# Patient Record
Sex: Male | Born: 1980 | Race: Black or African American | Hispanic: No | Marital: Single | Smoking: Never smoker
Health system: Southern US, Community
[De-identification: ages and names within clinical notes are randomized; demographics above are authoritative.]

---

## 2020-04-20 ENCOUNTER — Encounter (HOSPITAL_COMMUNITY): Payer: Self-pay | Admitting: Family Medicine

## 2020-04-20 ENCOUNTER — Emergency Department (HOSPITAL_COMMUNITY): Payer: BC Managed Care – PPO

## 2020-04-20 ENCOUNTER — Observation Stay (HOSPITAL_COMMUNITY): Payer: BC Managed Care – PPO

## 2020-04-20 ENCOUNTER — Observation Stay (HOSPITAL_COMMUNITY)
Admission: EM | Admit: 2020-04-20 | Discharge: 2020-04-21 | Disposition: A | Discharge status: Home / Self Care | Payer: BC Managed Care – PPO | Attending: Internal Medicine | Admitting: Internal Medicine

## 2020-04-20 DIAGNOSIS — H538 Other visual disturbances: Principal | ICD-10-CM | POA: Insufficient documentation

## 2020-04-20 DIAGNOSIS — H34231 Retinal artery branch occlusion, right eye: Secondary | ICD-10-CM | POA: Diagnosis not present

## 2020-04-20 DIAGNOSIS — Z20822 Contact with and (suspected) exposure to covid-19: Secondary | ICD-10-CM | POA: Diagnosis not present

## 2020-04-20 DIAGNOSIS — I10 Essential (primary) hypertension: Secondary | ICD-10-CM | POA: Diagnosis not present

## 2020-04-20 DIAGNOSIS — Z7982 Long term (current) use of aspirin: Secondary | ICD-10-CM | POA: Diagnosis not present

## 2020-04-20 DIAGNOSIS — H349 Unspecified retinal vascular occlusion: Secondary | ICD-10-CM | POA: Diagnosis present

## 2020-04-20 DIAGNOSIS — Z79899 Other long term (current) drug therapy: Secondary | ICD-10-CM | POA: Insufficient documentation

## 2020-04-20 DIAGNOSIS — E785 Hyperlipidemia, unspecified: Secondary | ICD-10-CM | POA: Diagnosis not present

## 2020-04-20 LAB — CBC
HCT: 42.9 % (ref 39.0–52.0)
Hemoglobin: 14.3 g/dL (ref 13.0–17.0)
MCH: 28 pg (ref 26.0–34.0)
MCHC: 33.3 g/dL (ref 30.0–36.0)
MCV: 84.1 fL (ref 80.0–100.0)
Platelets: 255 10*3/uL (ref 150–400)
RBC: 5.1 MIL/uL (ref 4.22–5.81)
RDW: 12.3 % (ref 11.5–15.5)
WBC: 8.3 10*3/uL (ref 4.0–10.5)
nRBC: 0 % (ref 0.0–0.2)

## 2020-04-20 LAB — PROTIME-INR
INR: 1.1 (ref 0.8–1.2)
Prothrombin Time: 13.7 seconds (ref 11.4–15.2)

## 2020-04-20 LAB — COMPREHENSIVE METABOLIC PANEL
ALT: 35 U/L (ref 0–44)
AST: 26 U/L (ref 15–41)
Albumin: 4.5 g/dL (ref 3.5–5.0)
Alkaline Phosphatase: 62 U/L (ref 38–126)
Anion gap: 8 (ref 5–15)
BUN: 12 mg/dL (ref 6–20)
CO2: 28 mmol/L (ref 22–32)
Calcium: 9.5 mg/dL (ref 8.9–10.3)
Chloride: 104 mmol/L (ref 98–111)
Creatinine, Ser: 1.14 mg/dL (ref 0.61–1.24)
GFR calc Af Amer: >60 mL/min (ref >60)
GFR calc non Af Amer: >60 mL/min (ref >60)
Glucose, Bld: 100 mg/dL — ABNORMAL HIGH (ref 70–99)
Potassium: 3.7 mmol/L (ref 3.5–5.1)
Sodium: 140 mmol/L (ref 135–145)
Total Bilirubin: 1.2 mg/dL (ref 0.3–1.2)
Total Protein: 7.7 g/dL (ref 6.5–8.1)

## 2020-04-20 LAB — DIFFERENTIAL
Abs Immature Granulocytes: 0.02 10*3/uL (ref 0.00–0.07)
Basophils Absolute: 0 10*3/uL (ref 0.0–0.1)
Basophils Relative: 0 %
Eosinophils Absolute: 0 10*3/uL (ref 0.0–0.5)
Eosinophils Relative: 1 %
Immature Granulocytes: 0 %
Lymphocytes Relative: 26 %
Lymphs Abs: 2.2 10*3/uL (ref 0.7–4.0)
Monocytes Absolute: 0.7 10*3/uL (ref 0.1–1.0)
Monocytes Relative: 9 %
Neutro Abs: 5.3 10*3/uL (ref 1.7–7.7)
Neutrophils Relative %: 64 %

## 2020-04-20 LAB — I-STAT CHEM 8, ED
BUN: 14 mg/dL (ref 6–20)
Calcium, Ion: 1.13 mmol/L — ABNORMAL LOW (ref 1.15–1.40)
Chloride: 102 mmol/L (ref 98–111)
Creatinine, Ser: 1.1 mg/dL (ref 0.61–1.24)
Glucose, Bld: 86 mg/dL (ref 70–99)
HCT: 44 % (ref 39.0–52.0)
Hemoglobin: 15 g/dL (ref 13.0–17.0)
Potassium: 3.8 mmol/L (ref 3.5–5.1)
Sodium: 141 mmol/L (ref 135–145)
TCO2: 29 mmol/L (ref 22–32)

## 2020-04-20 LAB — APTT: aPTT: 27 seconds (ref 24–36)

## 2020-04-20 LAB — RESPIRATORY PANEL BY RT PCR (FLU A&B, COVID)
Influenza A by PCR: NEGATIVE
Influenza B by PCR: NEGATIVE
SARS Coronavirus 2 by RT PCR: NEGATIVE

## 2020-04-20 LAB — I-STAT BETA HCG BLOOD, ED (MC, WL, AP ONLY): I-stat hCG, quantitative: <5 m[IU]/mL (ref <5)

## 2020-04-20 MED ORDER — IOHEXOL 350 MG/ML SOLN
75.0000 mL | Freq: Once | INTRAVENOUS | Status: AC | PRN
  Administered 2020-04-20: 75 mL via INTRAVENOUS

## 2020-04-20 MED ORDER — ACETAMINOPHEN 650 MG RE SUPP: 650.0000 mg | RECTAL | Status: DC | PRN

## 2020-04-20 MED ORDER — SENNOSIDES-DOCUSATE SODIUM 8.6-50 MG PO TABS: 1.0000 | ORAL_TABLET | Freq: Every evening | ORAL | Status: DC | PRN

## 2020-04-20 MED ORDER — SODIUM CHLORIDE 0.9 % IV SOLN: INTRAVENOUS | Status: AC

## 2020-04-20 MED ORDER — ENOXAPARIN SODIUM 40 MG/0.4ML ~~LOC~~ SOLN
40.0000 mg | SUBCUTANEOUS | Status: DC
  Administered 2020-04-20: 40 mg via SUBCUTANEOUS
  Filled 2020-04-20: qty 0.4

## 2020-04-20 MED ORDER — GADOBUTROL 1 MMOL/ML IV SOLN
9.0000 mL | Freq: Once | INTRAVENOUS | Status: AC | PRN
  Administered 2020-04-20: 9 mL via INTRAVENOUS

## 2020-04-20 MED ORDER — STROKE: EARLY STAGES OF RECOVERY BOOK
Freq: Once | Status: AC
  Filled 2020-04-20 (×2): qty 1

## 2020-04-20 MED ORDER — SODIUM CHLORIDE 0.9% FLUSH: 3.0000 mL | Freq: Once | INTRAVENOUS | Status: DC

## 2020-04-20 MED ORDER — ACETAMINOPHEN 325 MG PO TABS: 650.0000 mg | ORAL_TABLET | ORAL | Status: DC | PRN

## 2020-04-20 MED ORDER — ACETAMINOPHEN 160 MG/5ML PO SOLN: 650.0000 mg | ORAL | Status: DC | PRN

## 2020-04-20 NOTE — ED Triage Notes (Signed)
Pt is an [employer]er, retail ,from [place] that was at the eye doctor today due to a area in his eye that he cant see through, sent over to r/o stroke , woke up at 6 am with it

## 2020-04-20 NOTE — Consult Note (Signed)
Neurology Consultation Reason for Consult: Branch retinal artery occlusion Referring Physician: Stevie Kern, R   CC: Spot in my vision  History is obtained from: Patient  HPI: Peter Bush is a 39 y.o. male who was in his normal state of health and going to bed last night, on awakening this morning around 6 AM he noticed that he had a spot in his right eye.  Due to this he saw an optometrist who referred him to a retinal specialist.  The retinal specialist then evaluated him and referred him to the emergency department at [facility] for stroke evaluation.  The ER physician contacted the retinal specialist who advised that the patient undergo a stroke work-up for branch retinal artery occlusion.   LKW: 4/29 prior to bed tpa given?: no, out of window   ROS: A 14 point ROS was performed and is negative except as noted in the HPI.   History reviewed. No pertinent past medical history.   Family History  Problem Relation Age of Onset  . Stroke Maternal Grandmother      Social History:  reports that he has never smoked. He has never used smokeless tobacco. He reports previous alcohol use. He reports that he does not use drugs.   Exam: Current vital signs: BP (!) 157/107 (BP Location: Left Arm)   Pulse 93   Temp 98.4 F (36.9 C) (Oral)   Resp 18   Ht 5\' 9"  (1.753 m)   Wt 90.7 kg   SpO2 98%   BMI 29.53 kg/m  Vital signs in last 24 hours: Temp:  [98.4 F (36.9 C)] 98.4 F (36.9 C) (04/30 1813) Pulse Rate:  [93-100] 93 (04/30 1906) Resp:  [16-18] 18 (04/30 1906) BP: (155-157)/(98-107) 157/107 (04/30 1906) SpO2:  [98 %] 98 % (04/30 1906) Weight:  [90.7 kg] 90.7 kg (04/30 1813)   Physical Exam  Constitutional: Appears well-developed and well-nourished.  Psych: Affect appropriate to situation Eyes: No scleral injection HENT: No OP obstrucion MSK: no joint deformities.  Cardiovascular: Normal rate and regular rhythm.  Respiratory: Effort normal, non-labored  breathing GI: Soft.  No distension. There is no tenderness.  Skin: WDI  Neuro: Mental Status: Patient is awake, alert, oriented to person, place, month, year, and situation. Patient is able to give a clear and coherent history. No signs of aphasia or neglect Cranial Nerves: II: Visual Fields are full, with the exception of the nasal upper field on the right. Pupils are equal, round, and reactive to light.   III,IV, VI: EOMI without ptosis or diploplia.  V: Facial sensation is symmetric to temperature VII: Facial movement is symmetric.  VIII: hearing is intact to voice X: Uvula elevates symmetrically XI: Shoulder shrug is symmetric. XII: tongue is midline without atrophy or fasciculations.  Motor: Tone is normal. Bulk is normal. 5/5 strength was present in all four extremities.  Sensory: Sensation is symmetric to light touch and temperature in the arms and legs. 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  Given that this could be an embolic phenomena, stroke evaluation is underway.  Recommendations: - HgbA1c, fasting lipid panel - Frequent neuro checks - Echocardiogram -CTA head neck - Prophylactic therapy-Antiplatelet med: Aspirin - dose 325mg  PO or 300mg  PR - Risk factor modification - Telemetry monitoring - PT consult,  OT consult, Speech consult - Stroke team to follow   Roland Rack, MD [facility] [phone]  If 7pm- 7am, please page neurology on call as listed in [place].

## 2020-04-20 NOTE — H&P (Signed)
History and Physical    Peter Bush DOB: 04/11/1981 DOA: 04/20/2020  PCP: No primary care provider on file.   Patient coming from: Home   Chief Complaint: Right eye vision deficit   HPI: Peter Bush is a 39 y.o. male who denies any significant past medical history now presents with vision deficit involving the right eye.  Patient reports going to bed last night in his usual state of health and noting a vision deficit involving the right eye when he woke approximately 6 AM.  Patient reports he been in his usual state of health and went to bed last night in his normal state but noticed a vision deficit upon waking.  He described seeing a dark line in his upper left visual field that disappears if he closes his right eye.  He was seen by an eye doctor for this today, was sent to a retinal specialist, told there was a white spot on his retina, and was directed to the ED for further evaluation.  The visual field deficit has not worsened or improved.  There is no associated headache, change in hearing, or focal numbness or weakness.  He has never had this previously.  No recent fall or trauma.  No fevers or chills.  ED Course: Upon arrival to the ED, patient is found to be afebrile, saturating well on room air, and hypertensive to 160/107.  EKG features a sinus rhythm with probable early repolarization pattern.  Noncontrast head CT is negative for acute intracranial abnormality.  MRI brain and MRI orbits are negative.  Chemistry panel and CBC are unremarkable.  Ophthalmology and neurology were consulted by the ED physician, COVID-19 screening test not yet resulted, CTA head and neck in process, and hospitalist asked to admit.  Review of Systems:  All other systems reviewed and apart from HPI, are negative.  History reviewed. No pertinent past medical history.  History reviewed. No pertinent surgical history.   has no history on file for tobacco, alcohol, and  drug.  Allergies  Allergen Reactions  . Peanut-Containing Drug Products Swelling and Rash    Throat swelling    History reviewed. No pertinent family history.   Prior to Admission medications   Not on File    Physical Exam: Vitals:   04/20/20 1813 04/20/20 1906  BP: (!) 155/98 (!) 157/107  Pulse: 100 93  Resp: 16 18  Temp: 98.4 F (36.9 C)   TempSrc: Oral   SpO2: 98% 98%  Weight: 90.7 kg   Height: 5\' 9"  (1.753 m)      Constitutional: NAD, calm  Eyes: PERTLA, lids and conjunctivae normal ENMT: Mucous membranes are moist. Posterior pharynx clear of any exudate or lesions.   Neck: normal, supple, no masses, no thyromegaly Respiratory: clear to auscultation bilaterally, no wheezing, no crackles. No accessory muscle use.  Cardiovascular: S1 & S2 heard, regular rate and rhythm. No extremity edema. No significant JVD. Abdomen: No distension, no tenderness, soft. Bowel sounds active.  Musculoskeletal: no clubbing / cyanosis. No joint deformity upper and lower extremities.   Skin: no significant rashes, lesions, ulcers. Warm, dry, well-perfused. Neurologic: no facial asymmetry, no dysarthria or aphasia. Sensation intact, DTR normal. Strength 5/5 in all 4 limbs.  Psychiatric: Alert and oriented to person, place, and situation. Pleasant and cooperative.    Labs and Imaging on Admission: I have personally reviewed following labs and imaging studies  CBC: Recent Labs  Lab 04/20/20 1819 04/20/20 1855  WBC 8.3  --   NEUTROABS  5.3  --   HGB 14.3 15.0  HCT 42.9 44.0  MCV 84.1  --   PLT 255  --    Basic Metabolic Panel: Recent Labs  Lab 04/20/20 1819 04/20/20 1855  NA 140 141  K 3.7 3.8  CL 104 102  CO2 28  --   GLUCOSE 100* 86  BUN 12 14  CREATININE 1.14 1.10  CALCIUM 9.5  --    GFR: Estimated Creatinine Clearance: 99.4 mL/min (by C-G formula based on SCr of 1.1 mg/dL). Liver Function Tests: Recent Labs  Lab 04/20/20 1819  AST 26  ALT 35  ALKPHOS 62   BILITOT 1.2  PROT 7.7  ALBUMIN 4.5   No results for input(s): LIPASE, AMYLASE in the last 168 hours. No results for input(s): AMMONIA in the last 168 hours. Coagulation Profile: Recent Labs  Lab 04/20/20 1819  INR 1.1   Cardiac Enzymes: No results for input(s): CKTOTAL, CKMB, CKMBINDEX, TROPONINI in the last 168 hours. BNP (last 3 results) No results for input(s): PROBNP in the last 8760 hours. HbA1C: No results for input(s): HGBA1C in the last 72 hours. CBG: No results for input(s): GLUCAP in the last 168 hours. Lipid Profile: No results for input(s): CHOL, HDL, LDLCALC, TRIG, CHOLHDL, LDLDIRECT in the last 72 hours. Thyroid Function Tests: No results for input(s): TSH, T4TOTAL, FREET4, T3FREE, THYROIDAB in the last 72 hours. Anemia Panel: No results for input(s): VITAMINB12, FOLATE, FERRITIN, TIBC, IRON, RETICCTPCT in the last 72 hours. Urine analysis: No results found for: COLORURINE, APPEARANCEUR, LABSPEC, PHURINE, GLUCOSEU, HGBUR, BILIRUBINUR, KETONESUR, PROTEINUR, UROBILINOGEN, NITRITE, LEUKOCYTESUR Sepsis Labs: @LABRCNTIP (procalcitonin:4,lacticidven:4) )No results found for this or any previous visit (from the past 240 hour(s)).   Radiological Exams on Admission: CT HEAD WO CONTRAST  Result Date: 04/20/2020 CLINICAL DATA:  Focal neurologic deficit for greater than 6 hours, possible stroke. Pilot, woke with visual field defect. EXAM: CT HEAD WITHOUT CONTRAST TECHNIQUE: Contiguous axial images were obtained from the base of the skull through the vertex without intravenous contrast. COMPARISON:  None. FINDINGS: Brain: No evidence of acute infarction, hemorrhage, hydrocephalus, extra-axial collection or mass lesion/mass effect. Midline intracranial structures are normal. Normal appearance of the sella turcica. Cerebellar tonsils are normally positioned. Vascular: No hyperdense vessel or unexpected calcification. Skull: No calvarial fracture or suspicious osseous lesion. No  scalp swelling or hematoma. Sinuses/Orbits: Paranasal sinuses and mastoid air cells are predominantly clear. Extensive pneumatization of the mastoids and petrous apices. Included portions of the orbits are unremarkable within the limitations of this nonenhanced CT. Symmetric appearance of the globes with orthotopic lenses. No abnormality of the visible extraocular musculature, optic nerve sheath complexes or superior ophthalmic veins. Other: None IMPRESSION: No acute intracranial abnormality is evident. If there is persisting concern for infarct, MRI is more sensitive and specific for early changes of ischemia. Furthermore no CT findings to provide a plausible explanation for patient's visual field defect. Could consider additional MRI imaging of the orbits as well. Electronically Signed   By: 04/22/2020 M.D.   On: 04/20/2020 18:59   MR Brain W and Wo Contrast  Result Date: 04/20/2020 CLINICAL DATA:  TIA.  Transient vision loss. EXAM: MRI HEAD AND ORBITS WITHOUT AND WITH CONTRAST TECHNIQUE: Multiplanar, multiecho pulse sequences of the brain and surrounding structures were obtained without and with intravenous contrast. Multiplanar, multiecho pulse sequences of the orbits and surrounding structures were obtained including fat saturation techniques, before and after intravenous contrast administration. CONTRAST:  35mL GADAVIST GADOBUTROL 1 MMOL/ML IV SOLN  COMPARISON:  None. FINDINGS: MRI HEAD FINDINGS Brain: No acute infarction, hemorrhage, hydrocephalus, extra-axial collection or mass lesion. Normal white matter. Normal brainstem. Normal enhancement. Vascular: Normal arterial flow voids. Skull and upper cervical spine: Negative Other: None MRI ORBITS FINDINGS Orbits: Globe is normal bilaterally. Optic nerve normal bilaterally. Optic chiasm normal. Pituitary not enlarged. Cavernous sinus normal bilaterally. Extraocular muscles are normal bilaterally. No enhancing mass in the orbit. Visualized sinuses: Negative  Soft tissues: Negative IMPRESSION: Negative MRI brain with contrast Negative MRI orbit with contrast. Electronically Signed   By: Marlan Palau M.D.   On: 04/20/2020 21:13   MR ORBITS W WO CONTRAST  Result Date: 04/20/2020 CLINICAL DATA:  TIA.  Transient vision loss. EXAM: MRI HEAD AND ORBITS WITHOUT AND WITH CONTRAST TECHNIQUE: Multiplanar, multiecho pulse sequences of the brain and surrounding structures were obtained without and with intravenous contrast. Multiplanar, multiecho pulse sequences of the orbits and surrounding structures were obtained including fat saturation techniques, before and after intravenous contrast administration. CONTRAST:  14mL GADAVIST GADOBUTROL 1 MMOL/ML IV SOLN COMPARISON:  None. FINDINGS: MRI HEAD FINDINGS Brain: No acute infarction, hemorrhage, hydrocephalus, extra-axial collection or mass lesion. Normal white matter. Normal brainstem. Normal enhancement. Vascular: Normal arterial flow voids. Skull and upper cervical spine: Negative Other: None MRI ORBITS FINDINGS Orbits: Globe is normal bilaterally. Optic nerve normal bilaterally. Optic chiasm normal. Pituitary not enlarged. Cavernous sinus normal bilaterally. Extraocular muscles are normal bilaterally. No enhancing mass in the orbit. Visualized sinuses: Negative Soft tissues: Negative IMPRESSION: Negative MRI brain with contrast Negative MRI orbit with contrast. Electronically Signed   By: Marlan Palau M.D.   On: 04/20/2020 21:13    EKG: Independently reviewed. Sinus rhythm, probable early repolarization pattern.   Assessment/Plan  1. Retinal artery occlusion   - Patient woke at ~6 am with vision deficit involving right eye, was seen by retina specialist with concern for retinal artery occlusion and sent to ED  - No acute findings on CT head, MRI brain, and MRI orbits in ED  - Neurology consulted by ED physician and much appreciated  - Continue cardiac monitoring, neuro checks, PT/OT/SLP consultations  - CTA head  and neck pending, check echocardiogram, A1c, and fasting lipids     DVT prophylaxis: Lovenox  Code Status: Full  Family Communication: Discussed with patient  Disposition Plan:  Patient is from: Home  Anticipated d/c is to: Home  Anticipated d/c date is: 04/21/20 Patient currently: Pending neurology consultation and CVA workup   Consults called: Neurology and ophthalmology consulted by ED physician  Admission status: Observation     Briscoe Deutscher, MD [facility] Pager: See [email or web address]  If 7AM-7PM, please contact the daytime attending [email or web address]  04/20/2020, 9:18 PM

## 2020-04-20 NOTE — ED Provider Notes (Signed)
[facility] EMERGENCY DEPARTMENT Provider Note   CSN: [id number] Arrival date & time: 04/20/20  1804     History No chief complaint on file.   Peter Bush is a 39 y.o. male.  Presenting to ER with complaint of vision change.  Woke up this morning with seeing spots in his right eye, decreased vision right eye.  States went to [facility] eye doctor who referred him to retina specialist who stated that he had problem with one of the arteries in his eye and sent to ER for further eval.  States he still has this bloating/slight vision change in his right eye.  No pain, no other neurologic complaints.  Denies any chronic medical conditions, not on blood thinners.  Reviewed case with on-call for Dr. Lelan Pons - concern for branch retinal artery occlusion, recommending hospital admission for full stroke work-up.    HPI     History reviewed. No pertinent past medical history.  Patient Active Problem List   Diagnosis Date Noted  . 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 Stroke Maternal Grandmother     Social History   Tobacco Use  . Smoking status: Never Smoker  . Smokeless tobacco: Never Used  Substance Use Topics  . Alcohol use: Not Currently  . Drug use: Never    Home Medications Prior to Admission medications   Not on File    Allergies    Peanut-containing drug products  Review of Systems   Review of Systems  Constitutional: Negative for chills and fever.  HENT: Negative for ear pain and sore throat.   Eyes: Positive for visual disturbance. Negative for pain.  Respiratory: Negative for cough and shortness of breath.   Cardiovascular: Negative for chest pain and palpitations.  Gastrointestinal: Negative for abdominal pain and vomiting.  Genitourinary: Negative for dysuria and hematuria.  Musculoskeletal: Negative for arthralgias and back pain.  Skin: Negative for color change and rash.    Neurological: Negative for seizures and syncope.  All other systems reviewed and are negative.   Physical Exam Updated Vital Signs BP (!) 139/93   Pulse 81   Temp 98.4 F (36.9 C) (Oral)   Resp 16   Ht 5\' 9"  (1.753 m)   Wt 90.7 kg   SpO2 100%   BMI 29.53 kg/m   Physical Exam Vitals and nursing note reviewed.  Constitutional:      Appearance: He is well-developed.  HENT:     Head: Normocephalic and atraumatic.  Eyes:     Conjunctiva/sclera: Conjunctivae normal.  Cardiovascular:     Rate and Rhythm: Normal rate and regular rhythm.     Heart sounds: No murmur.  Pulmonary:     Effort: Pulmonary effort is normal. No respiratory distress.     Breath sounds: Normal breath sounds.  Abdominal:     Palpations: Abdomen is soft.     Tenderness: There is no abdominal tenderness.  Musculoskeletal:     Cervical back: Neck supple.  Skin:    General: Skin is warm and dry.  Neurological:     Mental Status: He is alert.     Comments: AAOx3 CN 2-12 intact, speech clear visual fields intact 5/5 strength in b/l UE and LE Sensation to light touch intact in b/l UE and LE Normal FNF Normal gait     ED Results / Procedures / Treatments   Labs (all labs ordered are listed, but only abnormal results are  displayed) Labs Reviewed  COMPREHENSIVE METABOLIC PANEL - Abnormal; Notable for the following components:      Result Value   Glucose, Bld 100 (*)    All other components within normal limits  I-STAT CHEM 8, ED - Abnormal; Notable for the following components:   Calcium, Ion 1.13 (*)    All other components within normal limits  RESPIRATORY PANEL BY RT PCR (FLU A&B, COVID)  PROTIME-INR  APTT  CBC  DIFFERENTIAL  HIV ANTIBODY (ROUTINE TESTING W REFLEX)  HEMOGLOBIN A1C  LIPID PANEL  SEDIMENTATION RATE  RAPID URINE DRUG SCREEN, HOSP PERFORMED  I-STAT BETA HCG BLOOD, ED ([facility], [facility], [facility] ONLY)    EKG EKG Interpretation  Date/Time:  Friday April 20 2020 19:29:38 EDT Ventricular  Rate:  76 PR Interval:    QRS Duration: 82 QT Interval:  358 QTC Calculation: 403 R Axis:   75 Text Interpretation: Sinus rhythm ST elev, probable normal early repol pattern No old tracing to compare Confirmed by Delora Fuel ([id number]) on 04/20/2020 11:08:07 PM   Radiology CT ANGIO HEAD W OR WO CONTRAST  Result Date: 04/20/2020 CLINICAL DATA:  Vision change right eye. EXAM: CT ANGIOGRAPHY HEAD AND NECK TECHNIQUE: Multidetector CT imaging of the head and neck was performed using the standard protocol during bolus administration of intravenous contrast. Multiplanar CT image reconstructions and MIPs were obtained to evaluate the vascular anatomy. Carotid stenosis measurements (when applicable) are obtained utilizing NASCET criteria, using the distal internal carotid diameter as the denominator. CONTRAST:  11mL OMNIPAQUE IOHEXOL 350 MG/ML SOLN COMPARISON:  CT head and MRI head 04/20/2020 FINDINGS: CTA NECK FINDINGS Aortic arch: Standard branching. Imaged portion shows no evidence of aneurysm or dissection. No significant stenosis of the major arch vessel origins. Normal aortic arch Right carotid system: Normal right carotid system without atherosclerotic disease stenosis or dissection. Left carotid system: Normal left carotid system without atherosclerotic disease, stenosis, or dissection. Vertebral arteries: Both vertebral arteries widely patent to the basilar. Skeleton: Negative Other neck: Negative Upper chest: Lung apices clear bilaterally. Review of the MIP images confirms the above findings CTA HEAD FINDINGS Anterior circulation: Cavernous carotid widely patent bilaterally without atherosclerotic disease or stenosis. Anterior and middle cerebral arteries normal bilaterally. Posterior circulation: Both vertebral arteries contribute to the basilar. PICA patent bilaterally. Basilar widely patent. Superior cerebellar and posterior cerebral arteries patent bilaterally without stenosis. Venous sinuses: Normal  venous enhancement Anatomic variants: None Review of the MIP images confirms the above findings IMPRESSION: 1. Negative CTA head and neck. Electronically Signed   By: Franchot Gallo M.D.   On: 04/20/2020 21:27   CT HEAD WO CONTRAST  Result Date: 04/20/2020 CLINICAL DATA:  Focal neurologic deficit for greater than 6 hours, possible stroke. Pilot, woke with visual field defect. EXAM: CT HEAD WITHOUT CONTRAST TECHNIQUE: Contiguous axial images were obtained from the base of the skull through the vertex without intravenous contrast. COMPARISON:  None. FINDINGS: Brain: No evidence of acute infarction, hemorrhage, hydrocephalus, extra-axial collection or mass lesion/mass effect. Midline intracranial structures are normal. Normal appearance of the sella turcica. Cerebellar tonsils are normally positioned. Vascular: No hyperdense vessel or unexpected calcification. Skull: No calvarial fracture or suspicious osseous lesion. No scalp swelling or hematoma. Sinuses/Orbits: Paranasal sinuses and mastoid air cells are predominantly clear. Extensive pneumatization of the mastoids and petrous apices. Included portions of the orbits are unremarkable within the limitations of this nonenhanced CT. Symmetric appearance of the globes with orthotopic lenses. No abnormality of the visible extraocular musculature, optic nerve  sheath complexes or superior ophthalmic veins. Other: None IMPRESSION: No acute intracranial abnormality is evident. If there is persisting concern for infarct, MRI is more sensitive and specific for early changes of ischemia. Furthermore no CT findings to provide a plausible explanation for patient's visual field defect. Could consider additional MRI imaging of the orbits as well. Electronically Signed   By: Kreg Shropshire M.D.   On: 04/20/2020 18:59   CT ANGIO NECK W OR WO CONTRAST  Result Date: 04/20/2020 CLINICAL DATA:  Vision change right eye. EXAM: CT ANGIOGRAPHY HEAD AND NECK TECHNIQUE: Multidetector CT  imaging of the head and neck was performed using the standard protocol during bolus administration of intravenous contrast. Multiplanar CT image reconstructions and MIPs were obtained to evaluate the vascular anatomy. Carotid stenosis measurements (when applicable) are obtained utilizing NASCET criteria, using the distal internal carotid diameter as the denominator. CONTRAST:  63mL OMNIPAQUE IOHEXOL 350 MG/ML SOLN COMPARISON:  CT head and MRI head 04/20/2020 FINDINGS: CTA NECK FINDINGS Aortic arch: Standard branching. Imaged portion shows no evidence of aneurysm or dissection. No significant stenosis of the major arch vessel origins. Normal aortic arch Right carotid system: Normal right carotid system without atherosclerotic disease stenosis or dissection. Left carotid system: Normal left carotid system without atherosclerotic disease, stenosis, or dissection. Vertebral arteries: Both vertebral arteries widely patent to the basilar. Skeleton: Negative Other neck: Negative Upper chest: Lung apices clear bilaterally. Review of the MIP images confirms the above findings CTA HEAD FINDINGS Anterior circulation: Cavernous carotid widely patent bilaterally without atherosclerotic disease or stenosis. Anterior and middle cerebral arteries normal bilaterally. Posterior circulation: Both vertebral arteries contribute to the basilar. PICA patent bilaterally. Basilar widely patent. Superior cerebellar and posterior cerebral arteries patent bilaterally without stenosis. Venous sinuses: Normal venous enhancement Anatomic variants: None Review of the MIP images confirms the above findings IMPRESSION: 1. Negative CTA head and neck. Electronically Signed   By: Marlan Palau M.D.   On: 04/20/2020 21:27   MR Brain W and Wo Contrast  Result Date: 04/20/2020 CLINICAL DATA:  TIA.  Transient vision loss. EXAM: MRI HEAD AND ORBITS WITHOUT AND WITH CONTRAST TECHNIQUE: Multiplanar, multiecho pulse sequences of the brain and surrounding  structures were obtained without and with intravenous contrast. Multiplanar, multiecho pulse sequences of the orbits and surrounding structures were obtained including fat saturation techniques, before and after intravenous contrast administration. CONTRAST:  59mL GADAVIST GADOBUTROL 1 MMOL/ML IV SOLN COMPARISON:  None. FINDINGS: MRI HEAD FINDINGS Brain: No acute infarction, hemorrhage, hydrocephalus, extra-axial collection or mass lesion. Normal white matter. Normal brainstem. Normal enhancement. Vascular: Normal arterial flow voids. Skull and upper cervical spine: Negative Other: None MRI ORBITS FINDINGS Orbits: Globe is normal bilaterally. Optic nerve normal bilaterally. Optic chiasm normal. Pituitary not enlarged. Cavernous sinus normal bilaterally. Extraocular muscles are normal bilaterally. No enhancing mass in the orbit. Visualized sinuses: Negative Soft tissues: Negative IMPRESSION: Negative MRI brain with contrast Negative MRI orbit with contrast. Electronically Signed   By: Marlan Palau M.D.   On: 04/20/2020 21:13   MR ORBITS W WO CONTRAST  Result Date: 04/20/2020 CLINICAL DATA:  TIA.  Transient vision loss. EXAM: MRI HEAD AND ORBITS WITHOUT AND WITH CONTRAST TECHNIQUE: Multiplanar, multiecho pulse sequences of the brain and surrounding structures were obtained without and with intravenous contrast. Multiplanar, multiecho pulse sequences of the orbits and surrounding structures were obtained including fat saturation techniques, before and after intravenous contrast administration. CONTRAST:  86mL GADAVIST GADOBUTROL 1 MMOL/ML IV SOLN COMPARISON:  None. FINDINGS: MRI  HEAD FINDINGS Brain: No acute infarction, hemorrhage, hydrocephalus, extra-axial collection or mass lesion. Normal white matter. Normal brainstem. Normal enhancement. Vascular: Normal arterial flow voids. Skull and upper cervical spine: Negative Other: None MRI ORBITS FINDINGS Orbits: Globe is normal bilaterally. Optic nerve normal  bilaterally. Optic chiasm normal. Pituitary not enlarged. Cavernous sinus normal bilaterally. Extraocular muscles are normal bilaterally. No enhancing mass in the orbit. Visualized sinuses: Negative Soft tissues: Negative IMPRESSION: Negative MRI brain with contrast Negative MRI orbit with contrast. Electronically Signed   By: Marlan Palau M.D.   On: 04/20/2020 21:13    Procedures Procedures (including critical care time)  Medications Ordered in ED Medications  sodium chloride flush (NS) 0.9 % injection 3 mL (3 mLs Intravenous Not Given 04/20/20 2201)   stroke: mapping our early stages of recovery book (0 each Does not apply Hold 04/20/20 2201)  0.9 %  sodium chloride infusion ( Intravenous New Bag/Given 04/20/20 2217)  acetaminophen (TYLENOL) tablet 650 mg (has no administration in time range)    Or  acetaminophen (TYLENOL) 160 MG/5ML solution 650 mg (has no administration in time range)    Or  acetaminophen (TYLENOL) suppository 650 mg (has no administration in time range)  senna-docusate (Senokot-S) tablet 1 tablet (has no administration in time range)  enoxaparin (LOVENOX) injection 40 mg (40 mg Subcutaneous Given 04/20/20 2217)  gadobutrol (GADAVIST) 1 MMOL/ML injection 9 mL (9 mLs Intravenous Contrast Given 04/20/20 2024)  iohexol (OMNIPAQUE) 350 MG/ML injection 75 mL (75 mLs Intravenous Contrast Given 04/20/20 2115)    ED Course  I have reviewed the triage vital signs and the nursing notes.  Pertinent labs & imaging results that were available during my care of the patient were reviewed by me and considered in my medical decision making (see chart for details).  Clinical Course as of Apr 21 5  Fri Apr 20, 2020  2034 Reviewed case with Dr. Amada Jupiter, he suggest calling ophthalmologist to obtain further clarification regarding the findings and their concerns   [RD]  2034 Attempted to call the retina specialist office, no answer, left voicemail with their emergency line   [RD]    2035 Recheck patient, in MRI now   [RD]  2038 Discussed case with provider on-call for Dr. Lelan Pons, he was concerned about a branch retinal artery occlusion, is requesting admission for stroke work-up   [RD]    Clinical Course User Index [RD] Milagros Loll, MD   MDM Rules/Calculators/A&P                       39 year old male presented to ER with concern for right eye visual disturbance.  Retina specialist concern for retinal artery branch occlusion and recommended admission for full stroke work-up.  Reviewed case with Amada Jupiter, neurology as well.  MRI brain and orbits ordered, CT angio ordered.  Consulted hospitalist for admission.  Dr. Antionette Char will accept.  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

## 2020-04-21 ENCOUNTER — Observation Stay (HOSPITAL_BASED_OUTPATIENT_CLINIC_OR_DEPARTMENT_OTHER): Payer: BC Managed Care – PPO

## 2020-04-21 ENCOUNTER — Other Ambulatory Visit: Payer: Self-pay

## 2020-04-21 DIAGNOSIS — I10 Essential (primary) hypertension: Secondary | ICD-10-CM | POA: Diagnosis not present

## 2020-04-21 DIAGNOSIS — I6389 Other cerebral infarction: Secondary | ICD-10-CM

## 2020-04-21 DIAGNOSIS — H34231 Retinal artery branch occlusion, right eye: Secondary | ICD-10-CM | POA: Diagnosis not present

## 2020-04-21 DIAGNOSIS — E785 Hyperlipidemia, unspecified: Secondary | ICD-10-CM | POA: Diagnosis not present

## 2020-04-21 LAB — HEMOGLOBIN A1C
Hgb A1c MFr Bld: 5.9 % — ABNORMAL HIGH (ref 4.8–5.6)
Mean Plasma Glucose: 122.63 mg/dL

## 2020-04-21 LAB — LIPID PANEL
Cholesterol: 160 mg/dL (ref 0–200)
HDL: 39 mg/dL — ABNORMAL LOW (ref >40)
LDL Cholesterol: 103 mg/dL — ABNORMAL HIGH (ref 0–99)
Total CHOL/HDL Ratio: 4.1 RATIO
Triglycerides: 92 mg/dL (ref <150)
VLDL: 18 mg/dL (ref 0–40)

## 2020-04-21 LAB — ECHOCARDIOGRAM COMPLETE
Height: 69 in
Weight: 3200 oz

## 2020-04-21 LAB — HIV ANTIBODY (ROUTINE TESTING W REFLEX): HIV Screen 4th Generation wRfx: NONREACTIVE

## 2020-04-21 LAB — SEDIMENTATION RATE: Sed Rate: 5 mm/hr (ref 0–16)

## 2020-04-21 LAB — RAPID URINE DRUG SCREEN, HOSP PERFORMED
Amphetamines: NOT DETECTED
Barbiturates: NOT DETECTED
Benzodiazepines: NOT DETECTED
Cocaine: NOT DETECTED
Opiates: NOT DETECTED
Tetrahydrocannabinol: NOT DETECTED

## 2020-04-21 MED ORDER — ASPIRIN EC 81 MG PO TBEC
81.0000 mg | DELAYED_RELEASE_TABLET | Freq: Every day | ORAL | Status: DC
  Administered 2020-04-21: 81 mg via ORAL
  Filled 2020-04-21: qty 1

## 2020-04-21 MED ORDER — ASPIRIN 81 MG PO TBEC: 81.0000 mg | DELAYED_RELEASE_TABLET | Freq: Every day | ORAL | 0 refills | Status: AC

## 2020-04-21 MED ORDER — ATORVASTATIN CALCIUM 40 MG PO TABS
40.0000 mg | ORAL_TABLET | Freq: Every day | ORAL | Status: DC
  Administered 2020-04-21: 40 mg via ORAL
  Filled 2020-04-21: qty 4

## 2020-04-21 MED ORDER — ATORVASTATIN CALCIUM 40 MG PO TABS: 40.0000 mg | ORAL_TABLET | Freq: Every day | ORAL | 0 refills | Status: AC

## 2020-04-21 NOTE — Progress Notes (Signed)
Patient discharged home.

## 2020-04-21 NOTE — Progress Notes (Signed)
  Speech Language Pathology Treatment:    Patient Details Name: Peter Bush MRN: [id number] DOB: March 07, 1981 Today's Date: 04/21/2020 Time:  -     Assessment / Plan / Recommendation Clinical Impression  Patient screened for cognitive/linguistic deficits. Pt appears to be at baseline. No further ST services recommended.      Luis Abed., MA, CCC-SLP 04/21/2020, 10:30 AM

## 2020-04-21 NOTE — Progress Notes (Signed)
Pt arrived to unit A&Ox4 and moving all extremities. Pt placed on tele and instructed to use call bell and wait for staff before getting out of bed. Pt verbalizes understanding.

## 2020-04-21 NOTE — Progress Notes (Signed)
  Echocardiogram 2D Echocardiogram has been performed.  Gerda Diss 04/21/2020, 8:34 AM

## 2020-04-21 NOTE — Progress Notes (Signed)
STROKE TEAM PROGRESS NOTE   SUBJECTIVE (INTERVAL HISTORY) No acute event overnight. Neuro stable. Stroke work up so far negative. Put on ASA and statin.   OBJECTIVE Temp:  [97.9 F (36.6 C)-98.4 F (36.9 C)] 98.1 F (36.7 C) (05/01 0925) Pulse Rate:  [60-100] 78 (05/01 0925) Cardiac Rhythm: Normal sinus rhythm (05/01 0700) Resp:  [14-18] 16 (05/01 0925) BP: (124-157)/(84-107) 130/85 (05/01 0925) SpO2:  [98 %-100 %] 98 % (05/01 0925) Weight:  [90.7 kg] 90.7 kg (04/30 1813)  No results for input(s): GLUCAP in the last 168 hours. Recent Labs  Lab 04/20/20 1819 04/20/20 1855  NA 140 141  K 3.7 3.8  CL 104 102  CO2 28  --   GLUCOSE 100* 86  BUN 12 14  CREATININE 1.14 1.10  CALCIUM 9.5  --    Recent Labs  Lab 04/20/20 1819  AST 26  ALT 35  ALKPHOS 62  BILITOT 1.2  PROT 7.7  ALBUMIN 4.5   Recent Labs  Lab 04/20/20 1819 04/20/20 1855  WBC 8.3  --   NEUTROABS 5.3  --   HGB 14.3 15.0  HCT 42.9 44.0  MCV 84.1  --   PLT 255  --    No results for input(s): CKTOTAL, CKMB, CKMBINDEX, TROPONINI in the last 168 hours. Recent Labs    04/20/20 1819  LABPROT 13.7  INR 1.1   No results for input(s): COLORURINE, LABSPEC, PHURINE, GLUCOSEU, HGBUR, BILIRUBINUR, KETONESUR, PROTEINUR, UROBILINOGEN, NITRITE, LEUKOCYTESUR in the last 72 hours.  Invalid input(s): APPERANCEUR     Component Value Date/Time   CHOL 160 04/21/2020 0404   TRIG 92 04/21/2020 0404   HDL 39 (L) 04/21/2020 0404   CHOLHDL 4.1 04/21/2020 0404   VLDL 18 04/21/2020 0404   LDLCALC 103 (H) 04/21/2020 0404   Lab Results  Component Value Date   HGBA1C 5.9 (H) 04/21/2020      Component Value Date/Time   LABOPIA NONE DETECTED 04/21/2020 0736   COCAINSCRNUR NONE DETECTED 04/21/2020 0736   LABBENZ NONE DETECTED 04/21/2020 0736   AMPHETMU NONE DETECTED 04/21/2020 0736   THCU NONE DETECTED 04/21/2020 0736   LABBARB NONE DETECTED 04/21/2020 0736    No results for input(s): ETH in the last 168  hours.  I have personally reviewed the radiological images below and agree with the radiology interpretations.  CT ANGIO HEAD W OR WO CONTRAST  Result Date: 04/20/2020 CLINICAL DATA:  Vision change right eye. EXAM: CT ANGIOGRAPHY HEAD AND NECK TECHNIQUE: Multidetector CT imaging of the head and neck was performed using the standard protocol during bolus administration of intravenous contrast. Multiplanar CT image reconstructions and MIPs were obtained to evaluate the vascular anatomy. Carotid stenosis measurements (when applicable) are obtained utilizing NASCET criteria, using the distal internal carotid diameter as the denominator. CONTRAST:  57mL OMNIPAQUE IOHEXOL 350 MG/ML SOLN COMPARISON:  CT head and MRI head 04/20/2020 FINDINGS: CTA NECK FINDINGS Aortic arch: Standard branching. Imaged portion shows no evidence of aneurysm or dissection. No significant stenosis of the major arch vessel origins. Normal aortic arch Right carotid system: Normal right carotid system without atherosclerotic disease stenosis or dissection. Left carotid system: Normal left carotid system without atherosclerotic disease, stenosis, or dissection. Vertebral arteries: Both vertebral arteries widely patent to the basilar. Skeleton: Negative Other neck: Negative Upper chest: Lung apices clear bilaterally. Review of the MIP images confirms the above findings CTA HEAD FINDINGS Anterior circulation: Cavernous carotid widely patent bilaterally without atherosclerotic disease or stenosis. Anterior and middle cerebral arteries  normal bilaterally. Posterior circulation: Both vertebral arteries contribute to the basilar. PICA patent bilaterally. Basilar widely patent. Superior cerebellar and posterior cerebral arteries patent bilaterally without stenosis. Venous sinuses: Normal venous enhancement Anatomic variants: None Review of the MIP images confirms the above findings IMPRESSION: 1. Negative CTA head and neck. Electronically Signed   By:  Marlan Palauharles  Clark M.D.   On: 04/20/2020 21:27   CT HEAD WO CONTRAST  Result Date: 04/20/2020 CLINICAL DATA:  Focal neurologic deficit for greater than 6 hours, possible stroke. Pilot, woke with visual field defect. EXAM: CT HEAD WITHOUT CONTRAST TECHNIQUE: Contiguous axial images were obtained from the base of the skull through the vertex without intravenous contrast. COMPARISON:  None. FINDINGS: Brain: No evidence of acute infarction, hemorrhage, hydrocephalus, extra-axial collection or mass lesion/mass effect. Midline intracranial structures are normal. Normal appearance of the sella turcica. Cerebellar tonsils are normally positioned. Vascular: No hyperdense vessel or unexpected calcification. Skull: No calvarial fracture or suspicious osseous lesion. No scalp swelling or hematoma. Sinuses/Orbits: Paranasal sinuses and mastoid air cells are predominantly clear. Extensive pneumatization of the mastoids and petrous apices. Included portions of the orbits are unremarkable within the limitations of this nonenhanced CT. Symmetric appearance of the globes with orthotopic lenses. No abnormality of the visible extraocular musculature, optic nerve sheath complexes or superior ophthalmic veins. Other: None IMPRESSION: No acute intracranial abnormality is evident. If there is persisting concern for infarct, MRI is more sensitive and specific for early changes of ischemia. Furthermore no CT findings to provide a plausible explanation for patient's visual field defect. Could consider additional MRI imaging of the orbits as well. Electronically Signed   By: Kreg ShropshirePrice  DeHay M.D.   On: 04/20/2020 18:59   CT ANGIO NECK W OR WO CONTRAST  Result Date: 04/20/2020 CLINICAL DATA:  Vision change right eye. EXAM: CT ANGIOGRAPHY HEAD AND NECK TECHNIQUE: Multidetector CT imaging of the head and neck was performed using the standard protocol during bolus administration of intravenous contrast. Multiplanar CT image reconstructions and  MIPs were obtained to evaluate the vascular anatomy. Carotid stenosis measurements (when applicable) are obtained utilizing NASCET criteria, using the distal internal carotid diameter as the denominator. CONTRAST:  75mL OMNIPAQUE IOHEXOL 350 MG/ML SOLN COMPARISON:  CT head and MRI head 04/20/2020 FINDINGS: CTA NECK FINDINGS Aortic arch: Standard branching. Imaged portion shows no evidence of aneurysm or dissection. No significant stenosis of the major arch vessel origins. Normal aortic arch Right carotid system: Normal right carotid system without atherosclerotic disease stenosis or dissection. Left carotid system: Normal left carotid system without atherosclerotic disease, stenosis, or dissection. Vertebral arteries: Both vertebral arteries widely patent to the basilar. Skeleton: Negative Other neck: Negative Upper chest: Lung apices clear bilaterally. Review of the MIP images confirms the above findings CTA HEAD FINDINGS Anterior circulation: Cavernous carotid widely patent bilaterally without atherosclerotic disease or stenosis. Anterior and middle cerebral arteries normal bilaterally. Posterior circulation: Both vertebral arteries contribute to the basilar. PICA patent bilaterally. Basilar widely patent. Superior cerebellar and posterior cerebral arteries patent bilaterally without stenosis. Venous sinuses: Normal venous enhancement Anatomic variants: None Review of the MIP images confirms the above findings IMPRESSION: 1. Negative CTA head and neck. Electronically Signed   By: Marlan Palauharles  Clark M.D.   On: 04/20/2020 21:27   MR Brain W and Wo Contrast  Result Date: 04/20/2020 CLINICAL DATA:  TIA.  Transient vision loss. EXAM: MRI HEAD AND ORBITS WITHOUT AND WITH CONTRAST TECHNIQUE: Multiplanar, multiecho pulse sequences of the brain and surrounding structures were obtained  without and with intravenous contrast. Multiplanar, multiecho pulse sequences of the orbits and surrounding structures were obtained  including fat saturation techniques, before and after intravenous contrast administration. CONTRAST:  9mL GADAVIST GADOBUTROL 1 MMOL/ML IV SOLN COMPARISON:  None. FINDINGS: MRI HEAD FINDINGS Brain: No acute infarction, hemorrhage, hydrocephalus, extra-axial collection or mass lesion. Normal white matter. Normal brainstem. Normal enhancement. Vascular: Normal arterial flow voids. Skull and upper cervical spine: Negative Other: None MRI ORBITS FINDINGS Orbits: Globe is normal bilaterally. Optic nerve normal bilaterally. Optic chiasm normal. Pituitary not enlarged. Cavernous sinus normal bilaterally. Extraocular muscles are normal bilaterally. No enhancing mass in the orbit. Visualized sinuses: Negative Soft tissues: Negative IMPRESSION: Negative MRI brain with contrast Negative MRI orbit with contrast. Electronically Signed   By: Marlan Palau M.D.   On: 04/20/2020 21:13   ECHOCARDIOGRAM COMPLETE  Result Date: 04/21/2020    ECHOCARDIOGRAM REPORT   Patient Name:   Peter Bush Date of Exam: 04/21/2020 Medical Rec #:  [id number]        Height:       69.0 in Accession #:    [id number]       Weight:       200.0 lb Date of Birth:  [birth year]       BSA:          2.066 m Patient Age:    38 years         BP:           124/86 mmHg Patient Gender: M                HR:           60 bpm. Exam Location:  Inpatient Procedure: 2D Echo, Cardiac Doppler and Color Doppler Indications:    Stroke 434.91/I163.9  History:        Patient has no prior history of Echocardiogram examinations.  Sonographer:    Ross Ludwig RDCS (AE) Referring Phys: [id number] TIMOTHY S OPYD  Sonographer Comments: Suboptimal subcostal window. IMPRESSIONS  1. Left ventricular ejection fraction, by estimation, is 60 to 65%. The left ventricle has normal function. The left ventricle has no regional wall motion abnormalities. Left ventricular diastolic parameters were normal.  2. Right ventricular systolic function is normal. The right ventricular size is normal.   3. The mitral valve is normal in structure. Trivial mitral valve regurgitation. No evidence of mitral stenosis.  4. The aortic valve is normal in structure. Aortic valve regurgitation is not visualized. No aortic stenosis is present.  5. The inferior vena cava is normal in size with greater than 50% respiratory variability, suggesting right atrial pressure of 3 mmHg. Conclusion(s)/Recommendation(s): No intracardiac source of embolism detected on this transthoracic study. A transesophageal echocardiogram is recommended to exclude cardiac source of embolism if clinically indicated. FINDINGS  Left Ventricle: Left ventricular ejection fraction, by estimation, is 60 to 65%. The left ventricle has normal function. The left ventricle has no regional wall motion abnormalities. The left ventricular internal cavity size was normal in size. There is  no left ventricular hypertrophy. Left ventricular diastolic parameters were normal. Right Ventricle: The right ventricular size is normal. No increase in right ventricular wall thickness. Right ventricular systolic function is normal. Left Atrium: Left atrial size was normal in size. Right Atrium: Right atrial size was normal in size. Pericardium: There is no evidence of pericardial effusion. Mitral Valve: The mitral valve is normal in structure. Normal mobility of the mitral valve leaflets. Trivial mitral valve regurgitation. No evidence  of mitral valve stenosis. MV peak gradient, 1.3 mmHg. The mean mitral valve gradient is 1.0 mmHg. Tricuspid Valve: The tricuspid valve is normal in structure. Tricuspid valve regurgitation is trivial. No evidence of tricuspid stenosis. Aortic Valve: The aortic valve is normal in structure. Aortic valve regurgitation is not visualized. No aortic stenosis is present. Aortic valve mean gradient measures 2.0 mmHg. Aortic valve peak gradient measures 4.0 mmHg. Aortic valve area, by VTI measures 3.06 cm. Pulmonic Valve: The pulmonic valve was normal  in structure. Pulmonic valve regurgitation is not visualized. No evidence of pulmonic stenosis. Aorta: The aortic root is normal in size and structure. Venous: The inferior vena cava is normal in size with greater than 50% respiratory variability, suggesting right atrial pressure of 3 mmHg. IAS/Shunts: No atrial level shunt detected by color flow Doppler.  LEFT VENTRICLE PLAX 2D LVIDd:         4.50 cm  Diastology LVIDs:         2.90 cm  LV e' lateral:   8.27 cm/s LV PW:         1.10 cm  LV E/e' lateral: 5.9 LV IVS:        1.40 cm  LV e' medial:    5.44 cm/s LVOT diam:     2.10 cm  LV E/e' medial:  9.0 LV SV:         66 LV SV Index:   32 LVOT Area:     3.46 cm  RIGHT VENTRICLE RV Basal diam:  3.30 cm RV S prime:     13.30 cm/s TAPSE (M-mode): 2.4 cm LEFT ATRIUM             Index       RIGHT ATRIUM           Index LA diam:        2.60 cm 1.26 cm/m  RA Area:     13.80 cm LA Vol (A2C):   45.1 ml 21.83 ml/m RA Volume:   33.30 ml  16.12 ml/m LA Vol (A4C):   23.4 ml 11.33 ml/m LA Biplane Vol: 35.1 ml 16.99 ml/m  AORTIC VALVE AV Area (Vmax):    3.04 cm AV Area (Vmean):   2.77 cm AV Area (VTI):     3.06 cm AV Vmax:           100.00 cm/s AV Vmean:          72.100 cm/s AV VTI:            0.215 m AV Peak Grad:      4.0 mmHg AV Mean Grad:      2.0 mmHg LVOT Vmax:         87.70 cm/s LVOT Vmean:        57.700 cm/s LVOT VTI:          0.190 m LVOT/AV VTI ratio: 0.88  AORTA Ao Root diam: 3.60 cm Ao Asc diam:  3.00 cm MITRAL VALVE               TRICUSPID VALVE MV Area (PHT): 4.68 cm    TR Peak grad:   10.1 mmHg MV Peak grad:  1.3 mmHg    TR Vmax:        159.00 cm/s MV Mean grad:  1.0 mmHg MV Vmax:       0.57 m/s    SHUNTS MV Vmean:      34.7 cm/s   Systemic VTI:  0.19 m MV Decel Time:  162 msec    Systemic Diam: 2.10 cm MV E velocity: 49.20 cm/s MV A velocity: 42.10 cm/s MV E/A ratio:  1.17 Tobias Alexander MD Electronically signed by Tobias Alexander MD Signature Date/Time: 04/21/2020/10:51:08 AM    Final    MR ORBITS W WO  CONTRAST  Result Date: 04/20/2020 CLINICAL DATA:  TIA.  Transient vision loss. EXAM: MRI HEAD AND ORBITS WITHOUT AND WITH CONTRAST TECHNIQUE: Multiplanar, multiecho pulse sequences of the brain and surrounding structures were obtained without and with intravenous contrast. Multiplanar, multiecho pulse sequences of the orbits and surrounding structures were obtained including fat saturation techniques, before and after intravenous contrast administration. CONTRAST:  38mL GADAVIST GADOBUTROL 1 MMOL/ML IV SOLN COMPARISON:  None. FINDINGS: MRI HEAD FINDINGS Brain: No acute infarction, hemorrhage, hydrocephalus, extra-axial collection or mass lesion. Normal white matter. Normal brainstem. Normal enhancement. Vascular: Normal arterial flow voids. Skull and upper cervical spine: Negative Other: None MRI ORBITS FINDINGS Orbits: Globe is normal bilaterally. Optic nerve normal bilaterally. Optic chiasm normal. Pituitary not enlarged. Cavernous sinus normal bilaterally. Extraocular muscles are normal bilaterally. No enhancing mass in the orbit. Visualized sinuses: Negative Soft tissues: Negative IMPRESSION: Negative MRI brain with contrast Negative MRI orbit with contrast. Electronically Signed   By: Marlan Palau M.D.   On: 04/20/2020 21:13    PHYSICAL EXAM  Temp:  [97.9 F (36.6 C)-98.4 F (36.9 C)] 98.1 F (36.7 C) (05/01 0925) Pulse Rate:  [60-100] 78 (05/01 0925) Resp:  [14-18] 16 (05/01 0925) BP: (124-157)/(84-107) 130/85 (05/01 0925) SpO2:  [98 %-100 %] 98 % (05/01 0925) Weight:  [90.7 kg] 90.7 kg (04/30 1813)  General - Well nourished, well developed, in no apparent distress.  Ophthalmologic - fundi not visualized due to noncooperation.  Cardiovascular - Regular rhythm and rate.  Mental Status -  Level of arousal and orientation to time, place, and person were intact. Language including expression, naming, repetition, comprehension was assessed and found intact. Fund of Knowledge was assessed  and was intact.  Cranial Nerves II - XII - II - Visual field intact OU. Right eye nasal upper field decreased acuity  III, IV, VI - Extraocular movements intact. V - Facial sensation intact bilaterally. VII - Facial movement intact bilaterally. VIII - Hearing & vestibular intact bilaterally. X - Palate elevates symmetrically. XI - Chin turning & shoulder shrug intact bilaterally. XII - Tongue protrusion intact.  Motor Strength - The patient's strength was normal in all extremities and pronator drift was absent.  Bulk was normal and fasciculations were absent.   Motor Tone - Muscle tone was assessed at the neck and appendages and was normal.  Reflexes - The patient's reflexes were symmetrical in all extremities and he had no pathological reflexes.  Sensory - Light touch, temperature/pinprick were assessed and were symmetrical.    Coordination - The patient had normal movements in the hands and feet with no ataxia or dysmetria.  Tremor was absent.  Gait and Station - deferred.   ASSESSMENT/PLAN Peter Bush is a 39 y.o. male with no pertinent medical history admitted for right BRAO. No tPA given due to outside window.    R BRAO   MRI  No infarct  CTA head and neck unremarkable  2D Echo EF 60-65%  LDL 103  HgbA1c 5.9  UDS neg  lovenox for VTE prophylaxis  No antithrombotic prior to admission, now on aspirin 81 mg daily. Continue ASA 81 on discharge  Patient counseled to be compliant with his antithrombotic medications  Ongoing  aggressive stroke risk factor management  Therapy recommendations:  none  Disposition:  Home  Recommend continued ophthalmology follow up  Hypertension . Stable . Follow up with PCP  Long term BP goal normotensive  Hyperlipidemia  Home meds:  none   LDL 103, goal < 70  Now on lipitor 40  Continue statin at discharge  Other Stroke Risk Factors    Other [facility] day # 0  Neurology will sign  off. Please call with questions. No neuro follow up needed at this time. Thanks for the consult.   Rosalin Hawking, MD PhD Stroke Neurology 04/21/2020 12:21 PM    To contact Stroke Continuity provider, please refer to [email or web address] After hours, contact General Neurology

## 2020-04-21 NOTE — Plan of Care (Signed)
Adequate for discharge.

## 2020-04-21 NOTE — Discharge Summary (Signed)
Discharge Summary  Peter Bush:[id number] DOB: 1981-04-05  PCP: System, Pcp Not In  Admit date: 04/20/2020 Discharge date: 04/21/2020  Time spent: 30mins  Recommendations for Outpatient Follow-up:  1. F/u with PCP within a week  for hospital discharge follow up 2. Follow-up with ophthalmologist  Discharge Diagnoses:  Active Hospital Problems   Diagnosis Date Noted  . 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    Discharge Condition: stable  Diet recommendation: heart healthy  Filed Weights   04/20/20 1813  Weight: 90.7 kg    History of present illness: ( per admitting MD Dr Antionette Charpyd) PCP: No primary care provider on file.   Patient coming from: Home   Chief Complaint: Right eye vision deficit   HPI: Peter RmJumaane Bloch is a 39 y.o. male who denies any significant past medical history now presents with vision deficit involving the right eye.  Patient reports going to bed last night in his usual state of health and noting a vision deficit involving the right eye when he woke approximately 6 AM.  Patient reports he been in his usual state of health and went to bed last night in his normal state but noticed a vision deficit upon waking.  He described seeing a dark line in his upper left visual field that disappears if he closes his right eye.  He was seen by an eye doctor for this today, was sent to a retinal specialist, told there was a white spot on his retina, and was directed to the ED for further evaluation.  The visual field deficit has not worsened or improved.  There is no associated headache, change in hearing, or focal numbness or weakness.  He has never had this previously.  No recent fall or trauma.  No fevers or chills.  ED Course: Upon arrival to the ED, patient is found to be afebrile, saturating well on room air, and hypertensive to 160/107.  EKG features a sinus rhythm with probable early  repolarization pattern.  Noncontrast head CT is negative for acute intracranial abnormality.  MRI brain and MRI orbits are negative.  Chemistry panel and CBC are unremarkable.  Ophthalmology and neurology were consulted by the ED physician, COVID-19 screening test not yet resulted, CTA head and neck in process, and hospitalist asked to admit.   Hospital Course:  Principal Problem:   Retinal artery occlusion    R BRAO Present with Right eye nasal upper field decreased acuity  MRI brain and right orbital no acute findings CT head and neck no acute findings Echocardiogram unremarkable Neurology consulted, recommend aspirin 81 mg daily and Lipitor 40 mg daily at discharge and outpatient ophthalmology follow-up   Dyslipidemia -Started on Lipitor  Possible hypertension Blood pressure on presentation 157/107 Blood pressure at discharge 130/85 Follow-up with PCP   Procedures:  Echocardiogram  CTA head neck  MRI brain and right orbital  Consultations:  Neurology  Discharge Exam: BP 130/85   Pulse 78   Temp 98.1 F (36.7 C) (Oral)   Resp 16   Ht 5\' 9"  (1.753 m)   Wt 90.7 kg   SpO2 98%   BMI 29.53 kg/m   General: NAD Cardiovascular: RRR Respiratory: CTA BL  Discharge Instructions You were cared for by a hospitalist during your hospital stay. If you have any questions about your discharge medications or the care you received while you were in the hospital after you are discharged, you can call the unit and  asked to speak with the hospitalist on call if the hospitalist that took care of you is not available. Once you are discharged, your primary care physician will handle any further medical issues. Please note that NO REFILLS for any discharge medications will be authorized once you are discharged, as it is imperative that you return to your primary care physician (or establish a relationship with a primary care physician if you do not have one) for your aftercare needs so  that they can reassess your need for medications and monitor your lab values.  Discharge Instructions    Diet general   Complete by: As directed    Increase activity slowly   Complete by: As directed      Allergies as of 04/21/2020      Reactions   Peanut-containing Drug Products Swelling, Rash   Throat swelling      Medication List    TAKE these medications   aspirin 81 MG EC tablet Take 1 tablet (81 mg total) by mouth daily.   atorvastatin 40 MG tablet Commonly known as: LIPITOR Take 1 tablet (40 mg total) by mouth daily.      Allergies  Allergen Reactions  . Peanut-Containing Drug Products Swelling and Rash    Throat swelling   Follow-up Information    f/u with pcp Follow up in 1 week(s).   Why: hospital discharge follow up, pcp to monitor cholesterol level       Follow-up Follow up in 1 day(s).   Why: With ophthalmologist           The results of significant diagnostics from this hospitalization (including imaging, microbiology, ancillary and laboratory) are listed below for reference.    Significant Diagnostic Studies: CT ANGIO HEAD W OR WO CONTRAST  Result Date: 04/20/2020 CLINICAL DATA:  Vision change right eye. EXAM: CT ANGIOGRAPHY HEAD AND NECK TECHNIQUE: Multidetector CT imaging of the head and neck was performed using the standard protocol during bolus administration of intravenous contrast. Multiplanar CT image reconstructions and MIPs were obtained to evaluate the vascular anatomy. Carotid stenosis measurements (when applicable) are obtained utilizing NASCET criteria, using the distal internal carotid diameter as the denominator. CONTRAST:  75mL OMNIPAQUE IOHEXOL 350 MG/ML SOLN COMPARISON:  CT head and MRI head 04/20/2020 FINDINGS: CTA NECK FINDINGS Aortic arch: Standard branching. Imaged portion shows no evidence of aneurysm or dissection. No significant stenosis of the major arch vessel origins. Normal aortic arch Right carotid system: Normal right  carotid system without atherosclerotic disease stenosis or dissection. Left carotid system: Normal left carotid system without atherosclerotic disease, stenosis, or dissection. Vertebral arteries: Both vertebral arteries widely patent to the basilar. Skeleton: Negative Other neck: Negative Upper chest: Lung apices clear bilaterally. Review of the MIP images confirms the above findings CTA HEAD FINDINGS Anterior circulation: Cavernous carotid widely patent bilaterally without atherosclerotic disease or stenosis. Anterior and middle cerebral arteries normal bilaterally. Posterior circulation: Both vertebral arteries contribute to the basilar. PICA patent bilaterally. Basilar widely patent. Superior cerebellar and posterior cerebral arteries patent bilaterally without stenosis. Venous sinuses: Normal venous enhancement Anatomic variants: None Review of the MIP images confirms the above findings IMPRESSION: 1. Negative CTA head and neck. Electronically Signed   By: Marlan Palau M.D.   On: 04/20/2020 21:27   CT HEAD WO CONTRAST  Result Date: 04/20/2020 CLINICAL DATA:  Focal neurologic deficit for greater than 6 hours, possible stroke. Pilot, woke with visual field defect. EXAM: CT HEAD WITHOUT CONTRAST TECHNIQUE: Contiguous axial images were obtained  from the base of the skull through the vertex without intravenous contrast. COMPARISON:  None. FINDINGS: Brain: No evidence of acute infarction, hemorrhage, hydrocephalus, extra-axial collection or mass lesion/mass effect. Midline intracranial structures are normal. Normal appearance of the sella turcica. Cerebellar tonsils are normally positioned. Vascular: No hyperdense vessel or unexpected calcification. Skull: No calvarial fracture or suspicious osseous lesion. No scalp swelling or hematoma. Sinuses/Orbits: Paranasal sinuses and mastoid air cells are predominantly clear. Extensive pneumatization of the mastoids and petrous apices. Included portions of the orbits  are unremarkable within the limitations of this nonenhanced CT. Symmetric appearance of the globes with orthotopic lenses. No abnormality of the visible extraocular musculature, optic nerve sheath complexes or superior ophthalmic veins. Other: None IMPRESSION: No acute intracranial abnormality is evident. If there is persisting concern for infarct, MRI is more sensitive and specific for early changes of ischemia. Furthermore no CT findings to provide a plausible explanation for patient's visual field defect. Could consider additional MRI imaging of the orbits as well. Electronically Signed   By: Kreg Shropshire M.D.   On: 04/20/2020 18:59   CT ANGIO NECK W OR WO CONTRAST  Result Date: 04/20/2020 CLINICAL DATA:  Vision change right eye. EXAM: CT ANGIOGRAPHY HEAD AND NECK TECHNIQUE: Multidetector CT imaging of the head and neck was performed using the standard protocol during bolus administration of intravenous contrast. Multiplanar CT image reconstructions and MIPs were obtained to evaluate the vascular anatomy. Carotid stenosis measurements (when applicable) are obtained utilizing NASCET criteria, using the distal internal carotid diameter as the denominator. CONTRAST:  17mL OMNIPAQUE IOHEXOL 350 MG/ML SOLN COMPARISON:  CT head and MRI head 04/20/2020 FINDINGS: CTA NECK FINDINGS Aortic arch: Standard branching. Imaged portion shows no evidence of aneurysm or dissection. No significant stenosis of the major arch vessel origins. Normal aortic arch Right carotid system: Normal right carotid system without atherosclerotic disease stenosis or dissection. Left carotid system: Normal left carotid system without atherosclerotic disease, stenosis, or dissection. Vertebral arteries: Both vertebral arteries widely patent to the basilar. Skeleton: Negative Other neck: Negative Upper chest: Lung apices clear bilaterally. Review of the MIP images confirms the above findings CTA HEAD FINDINGS Anterior circulation: Cavernous  carotid widely patent bilaterally without atherosclerotic disease or stenosis. Anterior and middle cerebral arteries normal bilaterally. Posterior circulation: Both vertebral arteries contribute to the basilar. PICA patent bilaterally. Basilar widely patent. Superior cerebellar and posterior cerebral arteries patent bilaterally without stenosis. Venous sinuses: Normal venous enhancement Anatomic variants: None Review of the MIP images confirms the above findings IMPRESSION: 1. Negative CTA head and neck. Electronically Signed   By: Marlan Palau M.D.   On: 04/20/2020 21:27   MR Brain W and Wo Contrast  Result Date: 04/20/2020 CLINICAL DATA:  TIA.  Transient vision loss. EXAM: MRI HEAD AND ORBITS WITHOUT AND WITH CONTRAST TECHNIQUE: Multiplanar, multiecho pulse sequences of the brain and surrounding structures were obtained without and with intravenous contrast. Multiplanar, multiecho pulse sequences of the orbits and surrounding structures were obtained including fat saturation techniques, before and after intravenous contrast administration. CONTRAST:  51mL GADAVIST GADOBUTROL 1 MMOL/ML IV SOLN COMPARISON:  None. FINDINGS: MRI HEAD FINDINGS Brain: No acute infarction, hemorrhage, hydrocephalus, extra-axial collection or mass lesion. Normal white matter. Normal brainstem. Normal enhancement. Vascular: Normal arterial flow voids. Skull and upper cervical spine: Negative Other: None MRI ORBITS FINDINGS Orbits: Globe is normal bilaterally. Optic nerve normal bilaterally. Optic chiasm normal. Pituitary not enlarged. Cavernous sinus normal bilaterally. Extraocular muscles are normal bilaterally. No enhancing mass in  the orbit. Visualized sinuses: Negative Soft tissues: Negative IMPRESSION: Negative MRI brain with contrast Negative MRI orbit with contrast. Electronically Signed   By: Marlan Palau M.D.   On: 04/20/2020 21:13   ECHOCARDIOGRAM COMPLETE  Result Date: 04/21/2020    ECHOCARDIOGRAM REPORT   Patient  Name:   Peter Bush Date of Exam: 04/21/2020 Medical Rec #:  [id number]        Height:       69.0 in Accession #:    [id number]       Weight:       200.0 lb Date of Birth:  Aug 03, 1981       BSA:          2.066 m Patient Age:    38 years         BP:           124/86 mmHg Patient Gender: M                HR:           60 bpm. Exam Location:  Inpatient Procedure: 2D Echo, Cardiac Doppler and Color Doppler Indications:    Stroke 434.91/I163.9  History:        Patient has no prior history of Echocardiogram examinations.  Sonographer:    Ross Ludwig RDCS (AE) Referring Phys: [id number] TIMOTHY S OPYD  Sonographer Comments: Suboptimal subcostal window. IMPRESSIONS  1. Left ventricular ejection fraction, by estimation, is 60 to 65%. The left ventricle has normal function. The left ventricle has no regional wall motion abnormalities. Left ventricular diastolic parameters were normal.  2. Right ventricular systolic function is normal. The right ventricular size is normal.  3. The mitral valve is normal in structure. Trivial mitral valve regurgitation. No evidence of mitral stenosis.  4. The aortic valve is normal in structure. Aortic valve regurgitation is not visualized. No aortic stenosis is present.  5. The inferior vena cava is normal in size with greater than 50% respiratory variability, suggesting right atrial pressure of 3 mmHg. Conclusion(s)/Recommendation(s): No intracardiac source of embolism detected on this transthoracic study. A transesophageal echocardiogram is recommended to exclude cardiac source of embolism if clinically indicated. FINDINGS  Left Ventricle: Left ventricular ejection fraction, by estimation, is 60 to 65%. The left ventricle has normal function. The left ventricle has no regional wall motion abnormalities. The left ventricular internal cavity size was normal in size. There is  no left ventricular hypertrophy. Left ventricular diastolic parameters were normal. Right Ventricle: The right  ventricular size is normal. No increase in right ventricular wall thickness. Right ventricular systolic function is normal. Left Atrium: Left atrial size was normal in size. Right Atrium: Right atrial size was normal in size. Pericardium: There is no evidence of pericardial effusion. Mitral Valve: The mitral valve is normal in structure. Normal mobility of the mitral valve leaflets. Trivial mitral valve regurgitation. No evidence of mitral valve stenosis. MV peak gradient, 1.3 mmHg. The mean mitral valve gradient is 1.0 mmHg. Tricuspid Valve: The tricuspid valve is normal in structure. Tricuspid valve regurgitation is trivial. No evidence of tricuspid stenosis. Aortic Valve: The aortic valve is normal in structure. Aortic valve regurgitation is not visualized. No aortic stenosis is present. Aortic valve mean gradient measures 2.0 mmHg. Aortic valve peak gradient measures 4.0 mmHg. Aortic valve area, by VTI measures 3.06 cm. Pulmonic Valve: The pulmonic valve was normal in structure. Pulmonic valve regurgitation is not visualized. No evidence of pulmonic stenosis. Aorta: The aortic root is normal in  size and structure. Venous: The inferior vena cava is normal in size with greater than 50% respiratory variability, suggesting right atrial pressure of 3 mmHg. IAS/Shunts: No atrial level shunt detected by color flow Doppler.  LEFT VENTRICLE PLAX 2D LVIDd:         4.50 cm  Diastology LVIDs:         2.90 cm  LV e' lateral:   8.27 cm/s LV PW:         1.10 cm  LV E/e' lateral: 5.9 LV IVS:        1.40 cm  LV e' medial:    5.44 cm/s LVOT diam:     2.10 cm  LV E/e' medial:  9.0 LV SV:         66 LV SV Index:   32 LVOT Area:     3.46 cm  RIGHT VENTRICLE RV Basal diam:  3.30 cm RV S prime:     13.30 cm/s TAPSE (M-mode): 2.4 cm LEFT ATRIUM             Index       RIGHT ATRIUM           Index LA diam:        2.60 cm 1.26 cm/m  RA Area:     13.80 cm LA Vol (A2C):   45.1 ml 21.83 ml/m RA Volume:   33.30 ml  16.12 ml/m LA Vol  (A4C):   23.4 ml 11.33 ml/m LA Biplane Vol: 35.1 ml 16.99 ml/m  AORTIC VALVE AV Area (Vmax):    3.04 cm AV Area (Vmean):   2.77 cm AV Area (VTI):     3.06 cm AV Vmax:           100.00 cm/s AV Vmean:          72.100 cm/s AV VTI:            0.215 m AV Peak Grad:      4.0 mmHg AV Mean Grad:      2.0 mmHg LVOT Vmax:         87.70 cm/s LVOT Vmean:        57.700 cm/s LVOT VTI:          0.190 m LVOT/AV VTI ratio: 0.88  AORTA Ao Root diam: 3.60 cm Ao Asc diam:  3.00 cm MITRAL VALVE               TRICUSPID VALVE MV Area (PHT): 4.68 cm    TR Peak grad:   10.1 mmHg MV Peak grad:  1.3 mmHg    TR Vmax:        159.00 cm/s MV Mean grad:  1.0 mmHg MV Vmax:       0.57 m/s    SHUNTS MV Vmean:      34.7 cm/s   Systemic VTI:  0.19 m MV Decel Time: 162 msec    Systemic Diam: 2.10 cm MV E velocity: 49.20 cm/s MV A velocity: 42.10 cm/s MV E/A ratio:  1.17 Ena Dawley MD Electronically signed by Ena Dawley MD Signature Date/Time: 04/21/2020/10:51:08 AM    Final    MR ORBITS W WO CONTRAST  Result Date: 04/20/2020 CLINICAL DATA:  TIA.  Transient vision loss. EXAM: MRI HEAD AND ORBITS WITHOUT AND WITH CONTRAST TECHNIQUE: Multiplanar, multiecho pulse sequences of the brain and surrounding structures were obtained without and with intravenous contrast. Multiplanar, multiecho pulse sequences of the orbits and surrounding structures were obtained including fat saturation techniques, before  and after intravenous contrast administration. CONTRAST:  81mL GADAVIST GADOBUTROL 1 MMOL/ML IV SOLN COMPARISON:  None. FINDINGS: MRI HEAD FINDINGS Brain: No acute infarction, hemorrhage, hydrocephalus, extra-axial collection or mass lesion. Normal white matter. Normal brainstem. Normal enhancement. Vascular: Normal arterial flow voids. Skull and upper cervical spine: Negative Other: None MRI ORBITS FINDINGS Orbits: Globe is normal bilaterally. Optic nerve normal bilaterally. Optic chiasm normal. Pituitary not enlarged. Cavernous sinus normal  bilaterally. Extraocular muscles are normal bilaterally. No enhancing mass in the orbit. Visualized sinuses: Negative Soft tissues: Negative IMPRESSION: Negative MRI brain with contrast Negative MRI orbit with contrast. Electronically Signed   By: Marlan Palau M.D.   On: 04/20/2020 21:13    Microbiology: Recent Results (from the past 240 hour(s))  Respiratory Panel by RT PCR (Flu A&B, Covid) - Nasopharyngeal Swab     Status: None   Collection Time: 04/20/20  8:58 PM   Specimen: Nasopharyngeal Swab  Result Value Ref Range Status   SARS Coronavirus 2 by RT PCR NEGATIVE NEGATIVE Final    Comment: (NOTE) SARS-CoV-2 target nucleic acids are NOT DETECTED. The SARS-CoV-2 RNA is generally detectable in upper respiratoy specimens during the acute phase of infection. The lowest concentration of SARS-CoV-2 viral copies this assay can detect is 131 copies/mL. A negative result does not preclude SARS-Cov-2 infection and should not be used as the sole basis for treatment or other patient management decisions. A negative result may occur with  improper specimen collection/handling, submission of specimen other than nasopharyngeal swab, presence of viral mutation(s) within the areas targeted by this assay, and inadequate number of viral copies (<131 copies/mL). A negative result must be combined with clinical observations, patient history, and epidemiological information. The expected result is Negative. Fact Sheet for Patients:  https://www.moore.com/ Fact Sheet for Healthcare Providers:  https://www.young.biz/ This test is not yet ap proved or cleared by the [place] FDA and  has been authorized for detection and/or diagnosis of SARS-CoV-2 by FDA under an Emergency Use Authorization (EUA). This EUA will remain  in effect (meaning this test can be used) for the duration of the COVID-19 declaration under Section 564(b)(1) of the Act, 21 U.S.C. section  360bbb-3(b)(1), unless the authorization is terminated or revoked sooner.    Influenza A by PCR NEGATIVE NEGATIVE Final   Influenza B by PCR NEGATIVE NEGATIVE Final    Comment: (NOTE) The Xpert Xpress SARS-CoV-2/FLU/RSV assay is intended as an aid in  the diagnosis of influenza from Nasopharyngeal swab specimens and  should not be used as a sole basis for treatment. Nasal washings and  aspirates are unacceptable for Xpert Xpress SARS-CoV-2/FLU/RSV  testing. Fact Sheet for Patients: https://www.moore.com/ Fact Sheet for Healthcare Providers: https://www.young.biz/ This test is not yet approved or cleared by the [place] FDA and  has been authorized for detection and/or diagnosis of SARS-CoV-2 by  FDA under an Emergency Use Authorization (EUA). This EUA will remain  in effect (meaning this test can be used) for the duration of the  Covid-19 declaration under Section 564(b)(1) of the Act, 21  U.S.C. section 360bbb-3(b)(1), unless the authorization is  terminated or revoked. Performed at [facility], [address] [place], [place] [address]      Labs: Basic Metabolic Panel: Recent Labs  Lab 04/20/20 1819 04/20/20 1855  NA 140 141  K 3.7 3.8  CL 104 102  CO2 28  --   GLUCOSE 100* 86  BUN 12 14  CREATININE 1.14 1.10  CALCIUM 9.5  --  Liver Function Tests: Recent Labs  Lab 04/20/20 1819  AST 26  ALT 35  ALKPHOS 62  BILITOT 1.2  PROT 7.7  ALBUMIN 4.5   No results for input(s): LIPASE, AMYLASE in the last 168 hours. No results for input(s): AMMONIA in the last 168 hours. CBC: Recent Labs  Lab 04/20/20 1819 04/20/20 1855  WBC 8.3  --   NEUTROABS 5.3  --   HGB 14.3 15.0  HCT 42.9 44.0  MCV 84.1  --   PLT 255  --    Cardiac Enzymes: No results for input(s): CKTOTAL, CKMB, CKMBINDEX, TROPONINI in the last 168 hours. BNP: BNP (last 3 results) No results for input(s): BNP in the last 8760 hours.  ProBNP  (last 3 results) No results for input(s): PROBNP in the last 8760 hours.  CBG: No results for input(s): GLUCAP in the last 168 hours.     Signed:  Albertine Grates MD, PhD, FACP  [facility] 04/21/2020, 12:25 PM

## 2020-04-21 NOTE — Progress Notes (Signed)
PT Cancellation Note  Patient Details Name: Peter Bush MRN: [id number] DOB: 03-Sep-1981   Cancelled Treatment:    Reason Eval/Treat Not Completed: PT screened, no needs identified, will sign off  Patient observed up with OT with no balance deficits. Compensating well for visual deficit. No PT eval needed.   Jerolyn Center, PT Pager [phone]  Zena Amos 04/21/2020, 9:26 AM

## 2020-04-21 NOTE — Evaluation (Signed)
Occupational Therapy Evaluation Patient Details Name: Peter Bush MRN: [id number] DOB: Apr 10, 1981 Today's Date: 04/21/2020    History of Present Illness Peter Bush is a 39 y.o. male who denies any significant past medical history now presents with vision deficit involving the right eye. Seen by retinal specialist with concerns for occlusion.   Clinical Impression   PTA pt fully independent, working as Emergency planning/management officer. Pt was flying plane at time of symptoms, does not normally live here. He landed the plane, was on a layover here in [place] and came to the hospital via retinal specialist recommendation. At time of eval, pt is independent for BADL and mobility. Session focused on assessing visual deficit and impact with BADL/IADL safety. Vision assessed as described below. Remaining deficit is mild, and described as small blur/line in upper L corner of R eye. Pt states he can look past this with both eyes, but cannot when L eye is occluded. Educated pt on focusing eyes on different distances, screens, etc. And doing games on phone to challenge tracking in several quadrants to strengthen eyes. Pt will need further visual assessment to safely return duties as Emergency planning/management officer. Reviewed stroke signs and symptoms with pt and wife. No further OT needs identified, OT will sign off. Thank you for this consult.    Follow Up Recommendations  No OT follow up    Equipment Recommendations  None recommended by OT    Recommendations for Other Services       Precautions / Restrictions Precautions Precautions: None Restrictions Weight Bearing Restrictions: No      Mobility Bed Mobility Overal bed mobility: Independent                Transfers Overall transfer level: Independent                    Balance Overall balance assessment: Independent                                         ADL either performed or assessed with clinical judgement   ADL Overall ADL's  : Independent                                             Vision Baseline Vision/History: No visual deficits Patient Visual Report: Blurring of vision;Peripheral vision impairment Vision Assessment?: Yes Eye Alignment: Within Functional Limits Ocular Range of Motion: Within Functional Limits Alignment/Gaze Preference: Within Defined Limits Tracking/Visual Pursuits: Able to track stimulus in all quads without difficulty Saccades: Within functional limits Convergence: Within functional limits Visual Fields: Other (comment)(in upper L corner of R eye, there is a "horizontal gray line" and blurring) Additional Comments: pt describes a gray line in upper L corner of vision and blurring. It is increased with L eye occluded. With both eyes open pt can "look past" the line. Vision is not impacting safe mobility at this time.     Perception     Praxis      Pertinent Vitals/Pain Pain Assessment: No/denies pain     Hand Dominance     Extremity/Trunk Assessment Upper Extremity Assessment Upper Extremity Assessment: Overall WFL for tasks assessed   Lower Extremity Assessment Lower Extremity Assessment: Overall WFL for tasks assessed       Communication Communication Communication: No  difficulties   Cognition Arousal/Alertness: Awake/alert Behavior During Therapy: WFL for tasks assessed/performed Overall Cognitive Status: Within Functional Limits for tasks assessed                                     General Comments       Exercises     Shoulder Instructions      Home Living Family/patient expects to be discharged to:: Private residence Living Arrangements: Spouse/significant other;Parent Available Help at Discharge: Family Type of Home: House       Home Layout: Multi-level Alternate Level Stairs-Number of Steps: 3 story home       Bathroom Toilet: Standard     Home Equipment: None          Prior Functioning/Environment  Level of Independence: Independent        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, retail        OT Problem List: Impaired vision/perception;Decreased knowledge of precautions      OT Treatment/Interventions:      OT Goals(Current goals can be found in the care plan section) Acute Rehab OT Goals 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 OT Goal Formulation: All assessment and education complete, DC therapy  OT Frequency:     Barriers to D/C:            Co-evaluation              AM-PAC OT "6 Clicks" Daily Activity     Outcome Measure Help from another person eating meals?: None Help from another person taking care of personal grooming?: None Help from another person toileting, which includes using toliet, bedpan, or urinal?: None Help from another person bathing (including washing, rinsing, drying)?: None Help from another person to put on and taking off regular upper body clothing?: None Help from another person to put on and taking off regular lower body clothing?: None 6 Click Score: 24   End of Session Nurse Communication: Mobility status  Activity Tolerance: Patient tolerated treatment well Patient left: in bed;with family/visitor present;with call bell/phone within reach  OT Visit Diagnosis: Other symptoms and signs involving the nervous system (R29.898)                Time: 5102-5852 OT Time Calculation (min): 17 min Charges:  OT General Charges $OT Visit: 1 Visit OT Evaluation $OT Eval Low Complexity: 1 Low  Dalphine Handing, MSOT, OTR/L Acute Rehabilitation Services [facility] Office Number: [phone] Pager: [phone]  Dalphine Handing 04/21/2020, 11:58 AM

## 2020-04-23 LAB — I-STAT BETA HCG BLOOD, ED (NOT ORDERABLE): I-stat hCG, quantitative: <5 m[IU]/mL (ref <5)

## 2021-10-29 IMAGING — MR MR HEAD WO/W CM
19 of 23 series · 37 of 48 positions shown · IV contrast (Gadavist)
Comparison: None.

CLINICAL DATA: TIA.  Transient vision loss.

EXAM:
MRI HEAD AND ORBITS WITHOUT AND WITH CONTRAST
TECHNIQUE: Multiplanar, multiecho pulse sequences of the brain and surrounding
structures were obtained without and with intravenous contrast.
Multiplanar, multiecho pulse sequences of the orbits and surrounding
structures were obtained including fat saturation techniques, before
and after intravenous contrast administration.
CONTRAST:  9mL GADAVIST GADOBUTROL 1 MMOL/ML IV SOLN

[Series 5: DWI · axial · 3.0mm · 0.88mm/px · z∈[-96,+56]mm · 7 of 104 slices shown (1 of 4)]
[im 1/104]
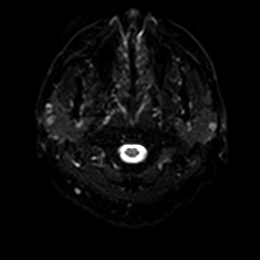
[im 18/104]
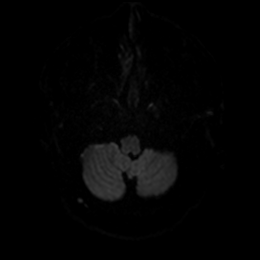
[im 35/104]
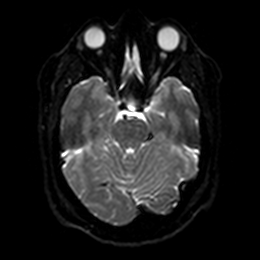
[im 52/104]
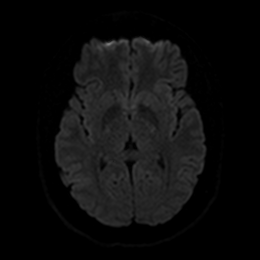
[im 69/104]
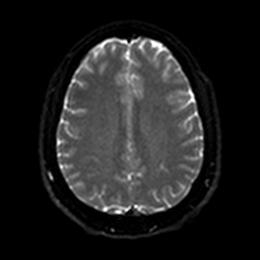
[im 86/104]
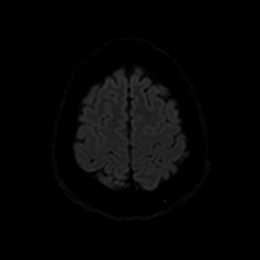
[im 104/104]
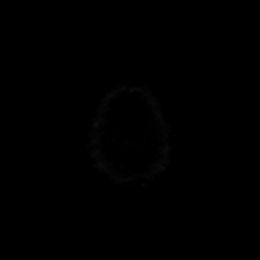

[Series 6: DWI · axial · 3.0mm · 0.88mm/px · z∈[-96,+56]mm · 3 of 52 slices shown (2 of 4)]
[im 1/52]
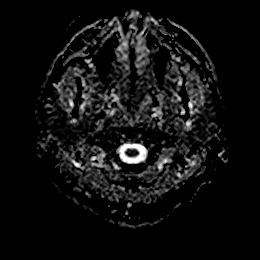
[im 26/52]
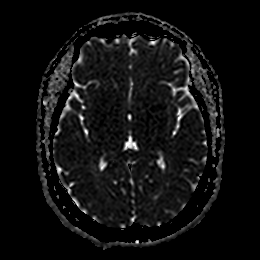
[im 52/52]
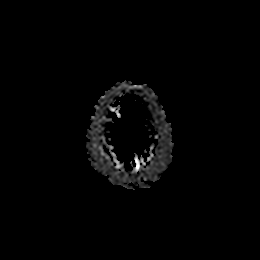

[Series 7: DWI · coronal · 4.0mm · 0.88mm/px · 4 of 76 slices shown (3 of 4)]
[im 1/76]
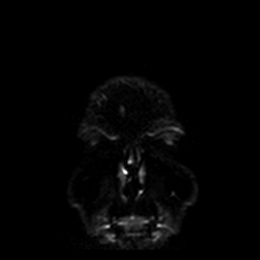
[im 26/76]
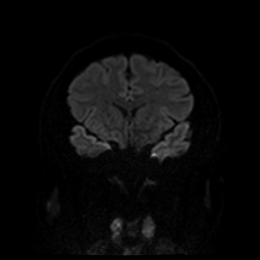
[im 51/76]
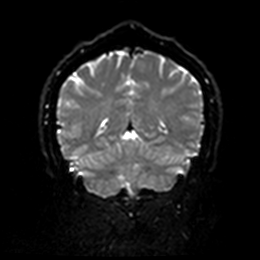
[im 76/76]
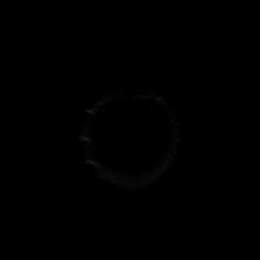

[Series 8: DWI · coronal · 4.0mm · 0.88mm/px · 2 of 38 slices shown (4 of 4)]
[im 1/38]
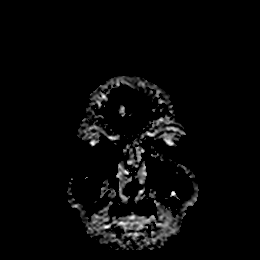
[im 38/38]
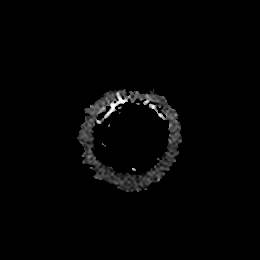

[Series 9: T1 · sagittal · 5.0mm · 0.75mm/px · 1 of 25 slices shown (1 of 3)]
[im 1/25]
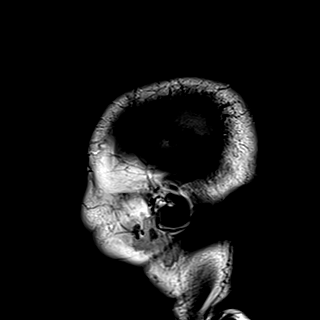

[Series 10: T2 · axial · 5.0mm · 0.72mm/px · 1 of 25 slices shown]
[im 1/25]
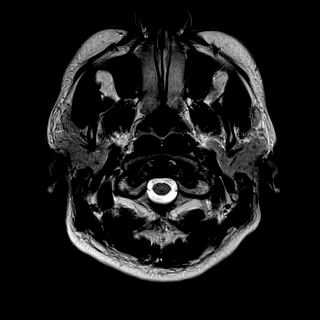

[Series 11: FLAIR · axial · 5.0mm · 0.45mm/px · 1 of 25 slices shown]
[im 1/25]
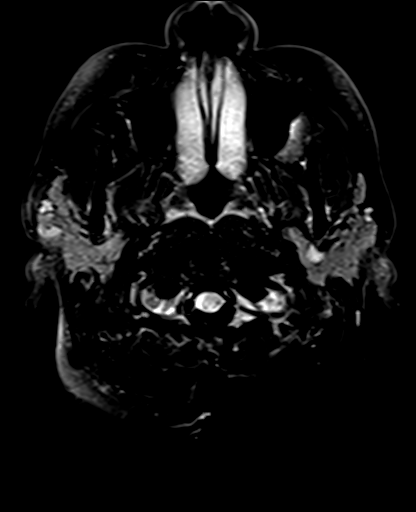

[Series 17: T1 · axial · non-contrast · 3.0mm · 0.37mm/px · 1 of 17 slices shown (2 of 3)]
[im 1/17]
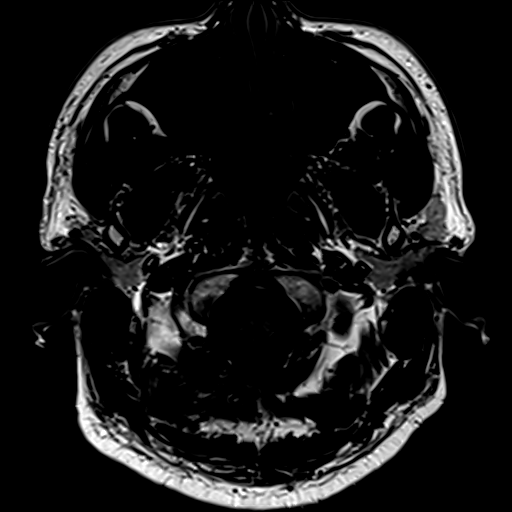

[Series 18: T2 fat-sat · axial · 3.0mm · 0.54mm/px · 1 of 17 slices shown (1 of 6)]
[im 1/17]
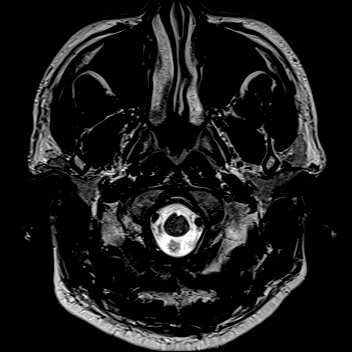

[Series 19: T2 fat-sat · axial · 3.0mm · 0.54mm/px · 1 of 17 slices shown (2 of 6)]
[im 1/17]
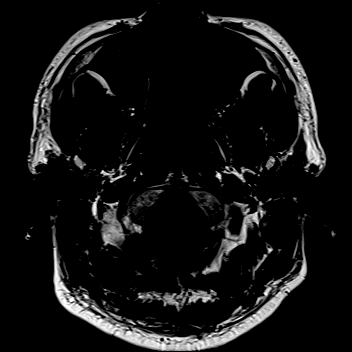

[Series 20: T2 fat-sat · axial · 3.0mm · 0.54mm/px · 1 of 17 slices shown (3 of 6)]
[im 1/17]
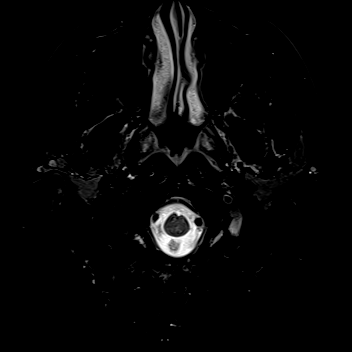

[Series 21: T2 fat-sat · coronal · 3.0mm · 0.54mm/px · 2 of 28 slices shown (4 of 6)]
[im 1/28]
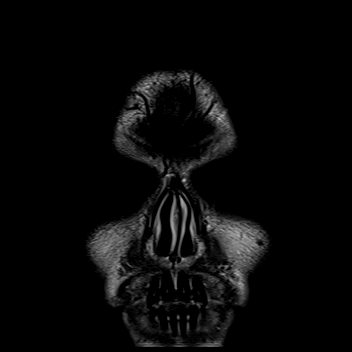
[im 28/28]
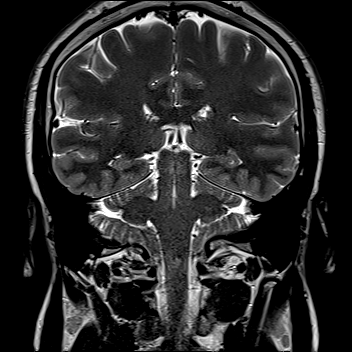

[Series 22: T2 fat-sat · coronal · 3.0mm · 0.54mm/px · 2 of 28 slices shown (5 of 6)]
[im 1/28]
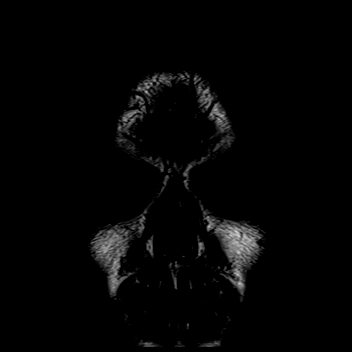
[im 28/28]
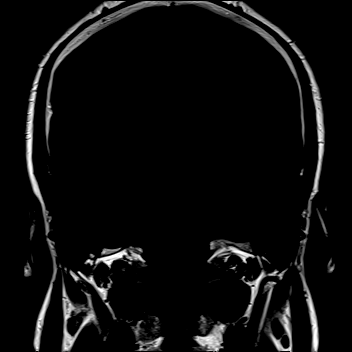

[Series 23: T2 fat-sat · coronal · 3.0mm · 0.54mm/px · 2 of 28 slices shown (6 of 6)]
[im 1/28]
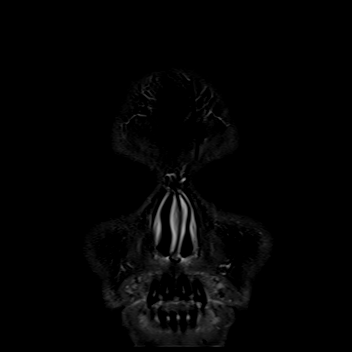
[im 28/28]
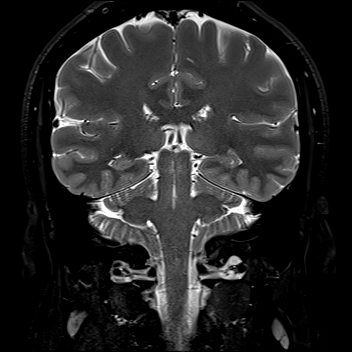

[Series 24: T1 · coronal · 3.0mm · 0.37mm/px · 2 of 28 slices shown (3 of 3)]
[im 1/28]
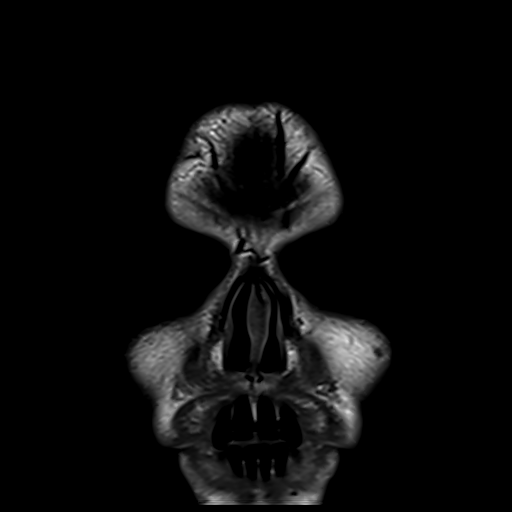
[im 28/28]
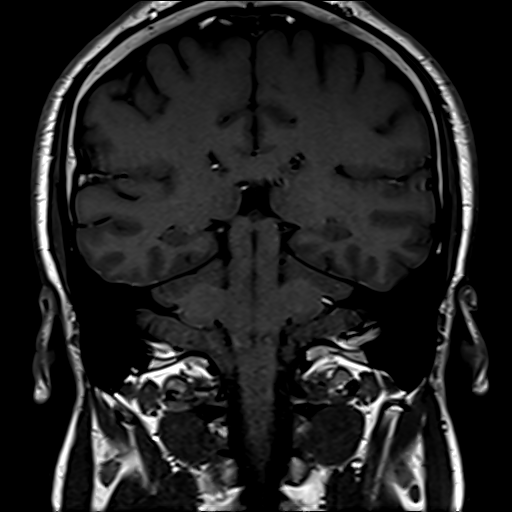

[Series 25: T1 fat-sat post-contrast · axial · 3.0mm · 0.37mm/px · 1 of 17 slices shown]
[im 1/17]
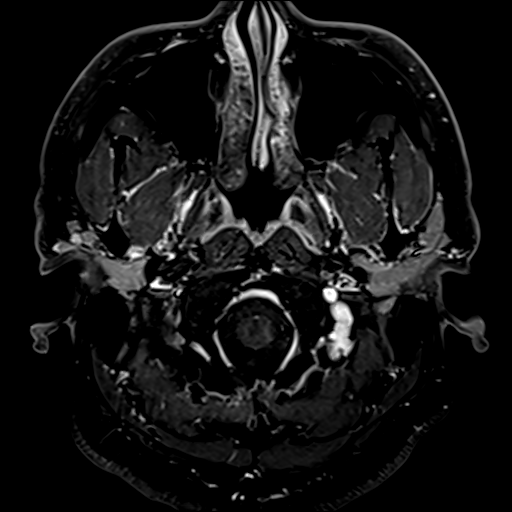

[Series 27: T2 post-contrast · coronal · 5.0mm · 0.72mm/px · 2 of 30 slices shown]
[im 1/30]
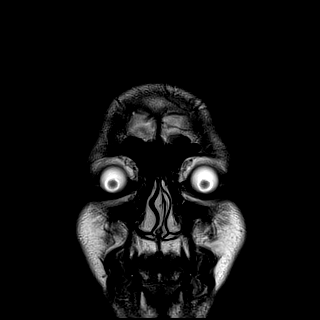
[im 30/30]
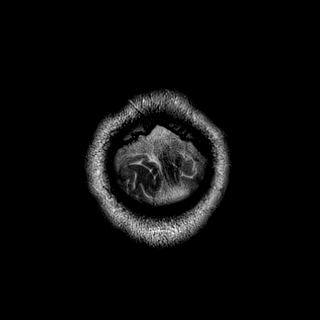

[Series 29: T1 post-contrast · coronal · 5.0mm · 0.34mm/px · 2 of 30 slices shown (1 of 2)]
[im 1/30]
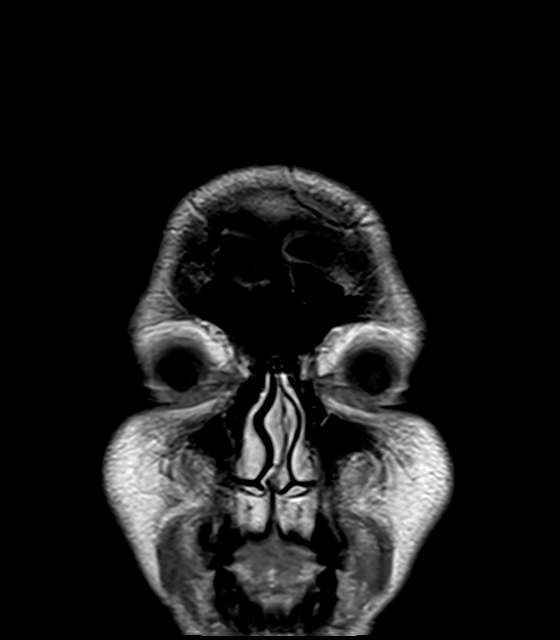
[im 30/30]
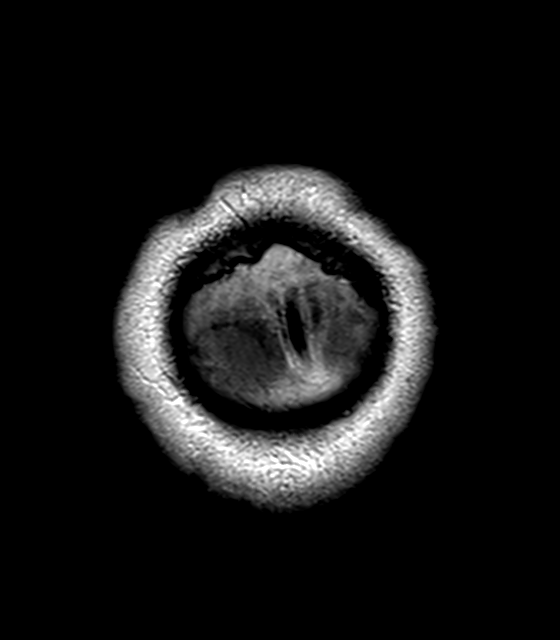

[Series 30: T1 post-contrast · sagittal · 5.0mm · 0.75mm/px · 1 of 25 slices shown (2 of 2)]
[im 1/25]
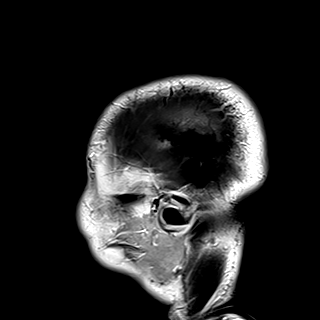

[37 of 48 positions shown; findings below may reference images not displayed]

FINDINGS: MRI HEAD FINDINGS

Brain: No acute infarction, hemorrhage, hydrocephalus, extra-axial
collection or mass lesion. Normal white matter. Normal brainstem.

Normal enhancement.

Vascular: Normal arterial flow voids.

Skull and upper cervical spine: Negative

Other: None

MRI ORBITS FINDINGS

Orbits: Globe is normal bilaterally. Optic nerve normal bilaterally.
Optic chiasm normal. Pituitary not enlarged. Cavernous sinus normal
bilaterally. Extraocular muscles are normal bilaterally. No
enhancing mass in the orbit.

Visualized sinuses: Negative

Soft tissues: Negative
IMPRESSION: Negative MRI brain with contrast

Negative MRI orbit with contrast.
